# Patient Record
Sex: Female | Born: 1955 | Race: Asian | Hispanic: No | Smoking: Never smoker
Health system: Southern US, Community
[De-identification: ages and names within clinical notes are randomized; demographics above are authoritative.]

## PROBLEM LIST (undated history)

## (undated) DIAGNOSIS — E079 Disorder of thyroid, unspecified: Secondary | ICD-10-CM

## (undated) HISTORY — DX: Disorder of thyroid, unspecified: E07.9

---

## 1997-07-17 ENCOUNTER — Emergency Department (HOSPITAL_COMMUNITY): Admission: EM | Admit: 1997-07-17 | Discharge: 1997-07-17 | Payer: Self-pay | Admitting: *Deleted

## 1997-11-18 ENCOUNTER — Other Ambulatory Visit: Admission: RE | Admit: 1997-11-18 | Discharge: 1997-11-18 | Payer: Self-pay | Admitting: Obstetrics and Gynecology

## 1998-06-02 ENCOUNTER — Other Ambulatory Visit: Admission: RE | Admit: 1998-06-02 | Discharge: 1998-06-02 | Payer: Self-pay | Admitting: Obstetrics and Gynecology

## 1999-09-14 ENCOUNTER — Other Ambulatory Visit: Admission: RE | Admit: 1999-09-14 | Discharge: 1999-09-14 | Payer: Self-pay | Admitting: Obstetrics and Gynecology

## 2000-09-17 ENCOUNTER — Other Ambulatory Visit: Admission: RE | Admit: 2000-09-17 | Discharge: 2000-09-17 | Payer: Self-pay | Admitting: Obstetrics and Gynecology

## 2001-12-24 ENCOUNTER — Other Ambulatory Visit: Admission: RE | Admit: 2001-12-24 | Discharge: 2001-12-24 | Payer: Self-pay | Admitting: Obstetrics and Gynecology

## 2002-12-29 ENCOUNTER — Other Ambulatory Visit: Admission: RE | Admit: 2002-12-29 | Discharge: 2002-12-29 | Payer: Self-pay | Admitting: Obstetrics and Gynecology

## 2004-02-03 ENCOUNTER — Other Ambulatory Visit: Admission: RE | Admit: 2004-02-03 | Discharge: 2004-02-03 | Payer: Self-pay | Admitting: Obstetrics and Gynecology

## 2005-01-26 ENCOUNTER — Other Ambulatory Visit: Admission: RE | Admit: 2005-01-26 | Discharge: 2005-01-26 | Payer: Self-pay | Admitting: Obstetrics and Gynecology

## 2005-03-06 ENCOUNTER — Ambulatory Visit (HOSPITAL_COMMUNITY): Admission: RE | Admit: 2005-03-06 | Discharge: 2005-03-06 | Payer: Self-pay | Admitting: Obstetrics and Gynecology

## 2011-01-04 ENCOUNTER — Other Ambulatory Visit: Payer: Self-pay | Admitting: Obstetrics and Gynecology

## 2011-06-04 ENCOUNTER — Telehealth: Payer: Self-pay | Admitting: Physician Assistant

## 2011-06-04 MED ORDER — LEVOTHYROXINE SODIUM 50 MCG PO TABS: 50.0000 ug | ORAL_TABLET | Freq: Every day | ORAL | Status: DC

## 2011-06-04 NOTE — Telephone Encounter (Signed)
Fax from pharmacy that Levoxyl is on backorder, requesting alternative.

## 2011-11-28 ENCOUNTER — Encounter: Payer: Self-pay | Admitting: Internal Medicine

## 2012-01-02 ENCOUNTER — Ambulatory Visit (INDEPENDENT_AMBULATORY_CARE_PROVIDER_SITE_OTHER): Payer: PRIVATE HEALTH INSURANCE | Admitting: Internal Medicine

## 2012-01-02 ENCOUNTER — Encounter: Payer: Self-pay | Admitting: Internal Medicine

## 2012-01-02 VITALS — BP 112/72 | HR 80 | Temp 99.0°F | Resp 16 | Ht 61.75 in | Wt 115.8 lb

## 2012-01-02 DIAGNOSIS — E785 Hyperlipidemia, unspecified: Secondary | ICD-10-CM

## 2012-01-02 DIAGNOSIS — E039 Hypothyroidism, unspecified: Secondary | ICD-10-CM

## 2012-01-02 LAB — COMPREHENSIVE METABOLIC PANEL
ALT: 18 U/L (ref 0–35)
BUN: 15 mg/dL (ref 6–23)
CO2: 27 mEq/L (ref 19–32)
Calcium: 9.5 mg/dL (ref 8.4–10.5)
Chloride: 108 mEq/L (ref 96–112)
Creat: 0.6 mg/dL (ref 0.50–1.10)
Glucose, Bld: 104 mg/dL — ABNORMAL HIGH (ref 70–99)

## 2012-01-02 LAB — LIPID PANEL
Cholesterol: 265 mg/dL — ABNORMAL HIGH (ref 0–200)
Total CHOL/HDL Ratio: 4 Ratio
Triglycerides: 231 mg/dL — ABNORMAL HIGH (ref <150)

## 2012-01-02 LAB — T4, FREE: Free T4: 1.3 ng/dL (ref 0.80–1.80)

## 2012-01-02 NOTE — Progress Notes (Addendum)
  Subjective:    Patient ID: Alyssa Velazquez, female    DOB: 1955/03/13, 56 y.o.   MRN: [id number]  HPIFollowup for lab and meds Patient Active Problem List  Diagnosis  . Hypothyroidism  . Hyperlipidemia   Remains asymptomatic with no weight gain, no insomnia, no excessive fatigue, no hypersomnolence Is currently off statin medication and has been Estimated one hour ago-Last meal    Review of Systems Noncontributory    Objective:   Physical Exam No thyromegaly or lymphadenopathy Heart regular without murmur rate 80 Vital signs stable       Assessment & Plan:   1. Hypothyroidism  CBC with Differential, TSH, T4, Free  2. Hyperlipidemia  Comprehensive metabolic panel, Lipid panel   pe 1/14 presc meds after labs  Ate 1 hr ago  Results for orders placed in visit on 01/02/12  CBC WITH DIFFERENTIAL      Component Value Range   WBC 4.0  4.0 - 10.5 K/uL   RBC 4.06  3.87 - 5.11 MIL/uL   Hemoglobin 12.9  12.0 - 15.0 g/dL   HCT 47.8  29.5 - 62.1 %   MCV 92.9  78.0 - 100.0 fL   MCH 31.8  26.0 - 34.0 pg   MCHC 34.2  30.0 - 36.0 g/dL   RDW 30.8  65.7 - 84.6 %   Platelets 304  150 - 400 K/uL   Neutrophils Relative 55  43 - 77 %   Neutro Abs 2.2  1.7 - 7.7 K/uL   Lymphocytes Relative 37  12 - 46 %   Lymphs Abs 1.5  0.7 - 4.0 K/uL   Monocytes Relative 7  3 - 12 %   Monocytes Absolute 0.3  0.1 - 1.0 K/uL   Eosinophils Relative 1  0 - 5 %   Eosinophils Absolute 0.0  0.0 - 0.7 K/uL   Basophils Relative 0  0 - 1 %   Basophils Absolute 0.0  0.0 - 0.1 K/uL   Smear Review Criteria for review not met    COMPREHENSIVE METABOLIC PANEL      Component Value Range   Sodium 144  135 - 145 mEq/L   Potassium 4.0  3.5 - 5.3 mEq/L   Chloride 108  96 - 112 mEq/L   CO2 27  19 - 32 mEq/L   Glucose, Bld 104 (*) 70 - 99 mg/dL   BUN 15  6 - 23 mg/dL   Creat 9.62  9.52 - 8.41 mg/dL   Total Bilirubin 0.6  0.3 - 1.2 mg/dL   Alkaline Phosphatase 65  39 - 117 U/L   AST 19  0 - 37 U/L   ALT 18  0  - 35 U/L   Total Protein 6.5  6.0 - 8.3 g/dL   Albumin 4.6  3.5 - 5.2 g/dL   Calcium 9.5  8.4 - 32.4 mg/dL  LIPID PANEL      Component Value Range   Cholesterol 265 (*) 0 - 200 mg/dL   Triglycerides 401 (*) <150 mg/dL   HDL 67  >02 mg/dL   Total CHOL/HDL Ratio 4.0     VLDL 46 (*) 0 - 40 mg/dL   LDL Cholesterol 725 (*) 0 - 99 mg/dL  TSH      Component Value Range   TSH 1.994  0.350 - 4.500 uIU/mL  T4, FREE      Component Value Range   Free T4 1.30  0.80 - 1.80 ng/dL

## 2012-01-03 DIAGNOSIS — E039 Hypothyroidism, unspecified: Secondary | ICD-10-CM | POA: Insufficient documentation

## 2012-01-03 DIAGNOSIS — E785 Hyperlipidemia, unspecified: Secondary | ICD-10-CM | POA: Insufficient documentation

## 2012-01-03 LAB — CBC WITH DIFFERENTIAL/PLATELET
Eosinophils Absolute: 0 10*3/uL (ref 0.0–0.7)
Eosinophils Relative: 1 % (ref 0–5)
Lymphocytes Relative: 37 % (ref 12–46)
Lymphs Abs: 1.5 10*3/uL (ref 0.7–4.0)
MCHC: 34.2 g/dL (ref 30.0–36.0)
Monocytes Absolute: 0.3 10*3/uL (ref 0.1–1.0)
Monocytes Relative: 7 % (ref 3–12)
RDW: 13.7 % (ref 11.5–15.5)

## 2012-01-07 ENCOUNTER — Encounter: Payer: Self-pay | Admitting: Internal Medicine

## 2012-01-07 MED ORDER — LEVOTHYROXINE SODIUM 50 MCG PO TABS: 50.0000 ug | ORAL_TABLET | Freq: Every day | ORAL | Status: DC

## 2012-01-07 NOTE — Addendum Note (Signed)
Addended by: Tonye Pearson on: 01/07/2012 12:57 PM   Modules accepted: Orders

## 2012-01-08 ENCOUNTER — Other Ambulatory Visit: Payer: Self-pay | Admitting: *Deleted

## 2012-01-08 DIAGNOSIS — E039 Hypothyroidism, unspecified: Secondary | ICD-10-CM

## 2012-01-08 MED ORDER — LEVOTHYROXINE SODIUM 50 MCG PO TABS: 50.0000 ug | ORAL_TABLET | Freq: Every day | ORAL | Status: DC

## 2012-02-27 ENCOUNTER — Ambulatory Visit (INDEPENDENT_AMBULATORY_CARE_PROVIDER_SITE_OTHER): Payer: PRIVATE HEALTH INSURANCE | Admitting: Internal Medicine

## 2012-02-27 ENCOUNTER — Encounter: Payer: Self-pay | Admitting: Internal Medicine

## 2012-02-27 VITALS — BP 116/78 | HR 77 | Temp 98.3°F | Resp 16 | Ht 61.34 in | Wt 114.0 lb

## 2012-02-27 DIAGNOSIS — Z0000 Encounter for general adult medical examination without abnormal findings: Secondary | ICD-10-CM

## 2012-02-27 DIAGNOSIS — Z23 Encounter for immunization: Secondary | ICD-10-CM

## 2012-02-28 NOTE — Progress Notes (Signed)
Subjective:    Patient ID: Alyssa Velazquez, female    DOB: [birth year], 57 y.o.   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 No pertinent past medical history.  Review of Systems  Constitutional: Negative for fever, activity change, appetite change, fatigue and unexpected weight change.  HENT: Positive for trouble swallowing. Negative for hearing loss, neck pain, dental problem, voice change and tinnitus.        About q 2 mos has dysphagia/sticking near larynx but reolves overnight-no reflux-no dyspepsia  Eyes: Negative for visual disturbance.  Respiratory: Negative for cough and shortness of breath.   Cardiovascular: Negative for chest pain, palpitations and leg swelling.  Gastrointestinal: Negative for abdominal pain, diarrhea, constipation, blood in stool and abdominal distention.  Genitourinary: Negative for flank pain, difficulty urinating and pelvic pain.  Musculoskeletal: Negative for myalgias, back pain, joint swelling, arthralgias and gait problem.  Skin: Negative for rash.  Neurological: Negative for dizziness, speech difficulty, weakness, light-headedness and headaches.  Hematological: Negative for adenopathy. Does not bruise/bleed easily.  Psychiatric/Behavioral: Negative for sleep disturbance, dysphoric mood and decreased concentration. The patient is not nervous/anxious.        Objective:   Physical Exam  Constitutional: She is oriented to person, place, and time. She appears well-developed and well-nourished.  HENT:  Right Ear: External ear normal.  Left Ear: External ear normal.  Nose: Nose normal.  Mouth/Throat: Oropharynx is clear and moist.       Tms clear  Eyes: Conjunctivae normal and EOM are normal. Pupils are equal, round, and reactive to light.  Neck: Normal range of motion. Neck supple. No thyromegaly  present.  Cardiovascular: Normal rate, regular rhythm, normal heart sounds and intact distal pulses.   No murmur heard. Pulmonary/Chest: Effort normal and breath sounds normal.  Abdominal: Soft. Bowel sounds are normal. She exhibits no mass. There is no tenderness.  Musculoskeletal: Normal range of motion. She exhibits no edema.  Lymphadenopathy:    She has no cervical adenopathy.  Neurological: She is alert and oriented to person, place, and time. She has normal reflexes. She displays normal reflexes. No cranial nerve deficit.  Skin: No rash noted.  Psychiatric: She has a normal mood and affect. Her behavior is normal. Judgment and thought content normal.     Results for orders placed in visit on 01/02/12  CBC WITH DIFFERENTIAL      Component Value Range   WBC 4.0  4.0 - 10.5 K/uL   RBC 4.06  3.87 - 5.11 MIL/uL   Hemoglobin 12.9  12.0 - 15.0 g/dL   HCT 59.5  63.8 - 75.6 %   MCV 92.9  78.0 - 100.0 fL   MCH 31.8  26.0 - 34.0 pg   MCHC 34.2  30.0 - 36.0 g/dL   RDW 43.3  29.5 - 18.8 %   Platelets 304  150 - 400 K/uL   Neutrophils Relative 55  43 - 77 %   Neutro Abs 2.2  1.7 - 7.7 K/uL   Lymphocytes Relative 37  12 - 46 %   Lymphs Abs 1.5  0.7 - 4.0 K/uL   Monocytes Relative 7  3 - 12 %   Monocytes Absolute 0.3  0.1 - 1.0 K/uL   Eosinophils Relative 1  0 - 5 %   Eosinophils Absolute 0.0  0.0 - 0.7 K/uL  Basophils Relative 0  0 - 1 %   Basophils Absolute 0.0  0.0 - 0.1 K/uL   Smear Review Criteria for review not met    COMPREHENSIVE METABOLIC PANEL      Component Value Range   Sodium 144  135 - 145 mEq/L   Potassium 4.0  3.5 - 5.3 mEq/L   Chloride 108  96 - 112 mEq/L   CO2 27  19 - 32 mEq/L   Glucose, Bld 104 (*) 70 - 99 mg/dL   BUN 15  6 - 23 mg/dL   Creat 9.62  9.52 - 8.41 mg/dL   Total Bilirubin 0.6  0.3 - 1.2 mg/dL   Alkaline Phosphatase 65  39 - 117 U/L   AST 19  0 - 37 U/L   ALT 18  0 - 35 U/L   Total Protein 6.5  6.0 - 8.3 g/dL   Albumin 4.6  3.5 - 5.2 g/dL    Calcium 9.5  8.4 - 32.4 mg/dL  LIPID PANEL      Component Value Range   Cholesterol 265 (*) 0 - 200 mg/dL   Triglycerides 401 (*) <150 mg/dL   HDL 67  >02 mg/dL   Total CHOL/HDL Ratio 4.0     VLDL 46 (*) 0 - 40 mg/dL   LDL Cholesterol 725 (*) 0 - 99 mg/dL  TSH      Component Value Range   TSH 1.994  0.350 - 4.500 uIU/mL  T4, FREE      Component Value Range   Free T4 1.30  0.80 - 1.80 ng/dL   Labs Good w/ low risk CV dz/good HDL     Assessment & Plan:  Annual PE Healthy w/ contr hypothy and borderline lipids Ba swall if dysph increases

## 2013-01-30 ENCOUNTER — Other Ambulatory Visit: Payer: Self-pay | Admitting: Internal Medicine

## 2013-01-30 NOTE — Telephone Encounter (Signed)
Spoke to pt. Reminded she needed an appt. She asked to be transferred to appts. She will be in the office in January. Sent in a 30 day supply.

## 2013-02-27 ENCOUNTER — Encounter: Payer: Self-pay | Admitting: Internal Medicine

## 2013-02-27 ENCOUNTER — Ambulatory Visit (INDEPENDENT_AMBULATORY_CARE_PROVIDER_SITE_OTHER): Payer: BC Managed Care – PPO | Admitting: Internal Medicine

## 2013-02-27 VITALS — BP 120/80 | HR 87 | Temp 98.0°F | Resp 16 | Ht 61.5 in | Wt 115.0 lb

## 2013-02-27 DIAGNOSIS — E785 Hyperlipidemia, unspecified: Secondary | ICD-10-CM

## 2013-02-27 DIAGNOSIS — Z0000 Encounter for general adult medical examination without abnormal findings: Secondary | ICD-10-CM

## 2013-02-27 DIAGNOSIS — E039 Hypothyroidism, unspecified: Secondary | ICD-10-CM

## 2013-02-27 DIAGNOSIS — Z23 Encounter for immunization: Secondary | ICD-10-CM

## 2013-02-27 LAB — CBC WITH DIFFERENTIAL/PLATELET
BASOS ABS: 0 10*3/uL (ref 0.0–0.1)
BASOS PCT: 0 % (ref 0–1)
EOS ABS: 0 10*3/uL (ref 0.0–0.7)
EOS PCT: 1 % (ref 0–5)
HCT: 41.7 % (ref 36.0–46.0)
Hemoglobin: 14.2 g/dL (ref 12.0–15.0)
LYMPHS PCT: 33 % (ref 12–46)
Lymphs Abs: 1.7 10*3/uL (ref 0.7–4.0)
MCH: 32.5 pg (ref 26.0–34.0)
MCHC: 34.1 g/dL (ref 30.0–36.0)
MCV: 95.4 fL (ref 78.0–100.0)
Monocytes Absolute: 0.3 10*3/uL (ref 0.1–1.0)
Monocytes Relative: 6 % (ref 3–12)
Neutro Abs: 3.1 10*3/uL (ref 1.7–7.7)
Neutrophils Relative %: 60 % (ref 43–77)
PLATELETS: 279 10*3/uL (ref 150–400)
RBC: 4.37 MIL/uL (ref 3.87–5.11)
RDW: 13.8 % (ref 11.5–15.5)
WBC: 5.2 10*3/uL (ref 4.0–10.5)

## 2013-02-27 LAB — LIPID PANEL
Cholesterol: 327 mg/dL — ABNORMAL HIGH (ref 0–200)
HDL: 69 mg/dL (ref >39)
LDL CALC: 232 mg/dL — AB (ref 0–99)
Total CHOL/HDL Ratio: 4.7 Ratio
Triglycerides: 128 mg/dL (ref <150)
VLDL: 26 mg/dL (ref 0–40)

## 2013-02-27 LAB — COMPREHENSIVE METABOLIC PANEL
ALBUMIN: 4.5 g/dL (ref 3.5–5.2)
ALT: 32 U/L (ref 0–35)
AST: 28 U/L (ref 0–37)
Alkaline Phosphatase: 68 U/L (ref 39–117)
BUN: 15 mg/dL (ref 6–23)
CHLORIDE: 105 meq/L (ref 96–112)
CO2: 29 meq/L (ref 19–32)
Calcium: 9.6 mg/dL (ref 8.4–10.5)
Creat: 0.57 mg/dL (ref 0.50–1.10)
GLUCOSE: 102 mg/dL — AB (ref 70–99)
POTASSIUM: 4.2 meq/L (ref 3.5–5.3)
SODIUM: 143 meq/L (ref 135–145)
TOTAL PROTEIN: 6.9 g/dL (ref 6.0–8.3)
Total Bilirubin: 0.8 mg/dL (ref 0.3–1.2)

## 2013-02-27 MED ORDER — HYDROCOD POLST-CHLORPHEN POLST 10-8 MG/5ML PO LQCR: 5.0000 mL | Freq: Two times a day (BID) | ORAL | Status: DC | PRN

## 2013-02-27 MED ORDER — LEVOTHYROXINE SODIUM 50 MCG PO TABS: ORAL_TABLET | ORAL | Status: DC

## 2013-02-27 NOTE — Progress Notes (Signed)
Subjective:    Patient ID: Alyssa Velazquez, female    DOB: May 12, 1955, 58 y.o.   MRN: [id number] This chart was scribed for Dr. Merla Riches, by Valera Castle, ED Scribe. This patient was seen in room 2 and the patient's care was started at 11:35 AM.  Chief Complaint  Patient presents with  . Annual Exam    HPI Alyssa Velazquez is a 58 y.o. female with h/o hypothyroidism who presents to the [facility] for an annual exam. Per her note from her last visit, her thyroid levels were normal and cholesterol was high.   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 She denies having flu immunization this year, but is compliant to having it today. Her tetanus is UTD, last in 2009. She reports her thyroidism is in check, but states that sometimes in the morning her thryoid seems to be enlarged.   She reports a headache, that radiates to her neck, onset 10 days ago. She also reports mild, intermittent cough, residual from recent cold. She reports sleeping normally, and states she has been eating well, vegetables and fruit. She denies exercising regularly. She denies fever, decreased hearing, trouble swallowing, and any other associated symptoms.    Immunizations up to date with last TDap-2009-has new grand baby 6 months Routine health maintenance issues today Dr. Tenny Craw GYN today   Patient Active Problem List   Diagnosis Date Noted  . Hypothyroidism 01/03/2012  . Hyperlipidemia 01/03/2012    Review of Systems  Constitutional: Negative for fever.  HENT: Positive for rhinorrhea. Negative for hearing loss and trouble swallowing.   Respiratory: Positive for cough.   Neurological: Positive for headaches.  Psychiatric/Behavioral: Negative for sleep disturbance and dysphoric mood.  All other systems reviewed and are negative.      Objective:   Physical Exam  Nursing note and vitals reviewed. Constitutional: She is oriented to person, place, and time. She appears well-developed  and well-nourished. No distress.  HENT:  Head: Normocephalic and atraumatic.  Right Ear: Tympanic membrane and external ear normal.  Left Ear: Tympanic membrane and external ear normal.  Nose: Mucosal edema and rhinorrhea present.  Mouth/Throat: Oropharynx is clear and moist and mucous membranes are normal.  Eyes: EOM are normal.  Neck: Neck supple. No thyromegaly present.  Cardiovascular: Normal rate, regular rhythm and normal heart sounds.   No murmur heard. Pulmonary/Chest: Effort normal and breath sounds normal. No respiratory distress. She has no wheezes. She has no rales.  Abdominal: Soft. She exhibits no mass. There is no tenderness.  Musculoskeletal: Normal range of motion. She exhibits no tenderness.  Lymphadenopathy:    She has no cervical adenopathy.  Neurological: She is alert and oriented to person, place, and time.  Skin: Skin is warm and dry.  Psychiatric: She has a normal mood and affect. Her behavior is normal.    BP 120/80  Pulse 87  Temp(Src) 98 F (36.7 C) (Oral)  Resp 16  Ht 5' 1.5" (1.562 m)  Wt 115 lb (52.164 kg)  BMI 21.38 kg/m2  SpO2 98%     Assessment & Plan:   1. Hypothyroidism   2. Hyperlipidemia   3. Annual physical exam   4.recent uri w/ persistant cough  Meds ordered this encounter  Medications  . levothyroxine (SYNTHROID, LEVOTHROID) 50 MCG tablet    Sig: take 1 tablet by mouth once daily    Dispense:  90 tablet    Refill:  3  .  chlorpheniramine-HYDROcodone (TUSSIONEX PENNKINETIC ER) 10-8 MG/5ML LQCR    Sig: Take 5 mLs by mouth every 12 (twelve) hours as needed for cough.    Dispense:  115 mL    Refill:  0       I have completed the patient encounter in its entirety as documented by the scribe, with editing by me where necessary. Austyn Perriello P. Merla Richesoolittle, M.D.

## 2013-02-28 LAB — T4, FREE: FREE T4: 1.38 ng/dL (ref 0.80–1.80)

## 2013-02-28 LAB — TSH: TSH: 3.33 u[IU]/mL (ref 0.350–4.500)

## 2013-03-03 ENCOUNTER — Other Ambulatory Visit: Payer: Self-pay | Admitting: Physician Assistant

## 2013-03-03 MED ORDER — ATORVASTATIN CALCIUM 10 MG PO TABS: 10.0000 mg | ORAL_TABLET | Freq: Every day | ORAL | Status: DC

## 2013-03-03 MED ORDER — LEVOTHYROXINE SODIUM 50 MCG PO TABS: ORAL_TABLET | ORAL | Status: DC

## 2013-03-03 NOTE — Addendum Note (Signed)
Addended by: Johnnette LitterARDWELL, Janaisha Tolsma M on: 03/03/2013 02:14 PM   Modules accepted: Orders, Medications

## 2013-05-26 ENCOUNTER — Ambulatory Visit (INDEPENDENT_AMBULATORY_CARE_PROVIDER_SITE_OTHER): Payer: BC Managed Care – PPO | Admitting: Internal Medicine

## 2013-05-26 VITALS — BP 120/75 | HR 68

## 2013-05-26 DIAGNOSIS — R05 Cough: Secondary | ICD-10-CM

## 2013-05-26 DIAGNOSIS — R059 Cough, unspecified: Secondary | ICD-10-CM

## 2013-05-26 DIAGNOSIS — J019 Acute sinusitis, unspecified: Secondary | ICD-10-CM

## 2013-05-26 MED ORDER — AMOXICILLIN 875 MG PO TABS: 875.0000 mg | ORAL_TABLET | Freq: Two times a day (BID) | ORAL | Status: DC

## 2013-05-26 MED ORDER — HYDROCOD POLST-CHLORPHEN POLST 10-8 MG/5ML PO LQCR: 5.0000 mL | Freq: Two times a day (BID) | ORAL | Status: DC | PRN

## 2013-05-26 MED ORDER — BENZONATATE 100 MG PO CAPS: 100.0000 mg | ORAL_CAPSULE | Freq: Two times a day (BID) | ORAL | Status: DC | PRN

## 2013-05-26 NOTE — Progress Notes (Signed)
   Subjective:    Patient ID: Alyssa Velazquez, female    DOB: 1955/12/12, 58 y.o.   MRN: [id number]  [place]-sick last 10 days with cong/cough/hoarseness/abd muscles sore from cramping Fever at night  Review of Systems Noncontributory    Objective:   Physical Exam Blood pressure and pulse stable TMs clear Nares congested/purulent mucus/tendon maxillary areas Throat clear/no nodes Chest clear to auscultation        Assessment & Plan:  Sinusitis with cough Meds ordered this encounter  Medications  . amoxicillin (AMOXIL) 875 MG tablet    Sig: Take 1 tablet (875 mg total) by mouth 2 (two) times daily.    Dispense:  20 tablet    Refill:  0  . chlorpheniramine-HYDROcodone (TUSSIONEX PENNKINETIC ER) 10-8 MG/5ML LQCR    Sig: Take 5 mLs by mouth every 12 (twelve) hours as needed for cough.    Dispense:  115 mL    Refill:  0  . benzonatate (TESSALON) 100 MG capsule    Sig: Take 1 capsule (100 mg total) by mouth 2 (two) times daily as needed for cough.    Dispense:  20 capsule    Refill:  0   Recheck appointment in 10 days

## 2013-06-03 ENCOUNTER — Ambulatory Visit (INDEPENDENT_AMBULATORY_CARE_PROVIDER_SITE_OTHER): Payer: BC Managed Care – PPO | Admitting: Internal Medicine

## 2013-06-03 VITALS — BP 124/80 | HR 69 | Temp 98.0°F | Resp 16 | Ht 61.5 in | Wt 114.0 lb

## 2013-06-03 DIAGNOSIS — E785 Hyperlipidemia, unspecified: Secondary | ICD-10-CM

## 2013-06-03 DIAGNOSIS — E039 Hypothyroidism, unspecified: Secondary | ICD-10-CM

## 2013-06-03 DIAGNOSIS — R059 Cough, unspecified: Secondary | ICD-10-CM

## 2013-06-03 DIAGNOSIS — R05 Cough: Secondary | ICD-10-CM

## 2013-06-03 LAB — LIPID PANEL
Cholesterol: 170 mg/dL (ref 0–200)
HDL: 51 mg/dL (ref >39)
LDL Cholesterol: 107 mg/dL — ABNORMAL HIGH (ref 0–99)
Total CHOL/HDL Ratio: 3.3 Ratio
Triglycerides: 62 mg/dL (ref <150)
VLDL: 12 mg/dL (ref 0–40)

## 2013-06-03 MED ORDER — HYDROCOD POLST-CHLORPHEN POLST 10-8 MG/5ML PO LQCR: 5.0000 mL | Freq: Two times a day (BID) | ORAL | Status: DC | PRN

## 2013-06-03 NOTE — Progress Notes (Signed)
   Subjective:    Patient ID: Alyssa Velazquez, female    DOB: 09-24-1955, 58 y.o.   MRN: [id number] This chart was scribed for Tonye Pearsonobert P Kyira Volkert, MD by Charline BillsEssence Howell, ED Scribe. The patient was seen in room 25. Patient's care was started at 11:32 AM.  HPI HPI Comments: Alyssa Velazquez is a 58 y.o. female who presents to the [facility] for lab work for thyroid and cholesterol.   She coughs more at night, but states that the medication has improved her productive cough. Pt shares her desire for Tussionex refill. She reports associated postnasal drip and feelings of a heavy head at times. Pt denies seasonal allergies. She reports hearing loss in her L ear, but states that it has improved over the past 2 days. She will finish her antibiotic tomorrow. She reports feeling fatigue only with taking the antibiotic.     Review of Systems  Constitutional: Negative for fever and fatigue.  Respiratory: Negative for shortness of breath.       Objective:   Physical Exam  Nursing note and vitals reviewed. Constitutional: She is oriented to person, place, and time. She appears well-developed and well-nourished. No distress.  HENT:  Head: Normocephalic and atraumatic.  Right Ear: External ear normal.  Left Ear: External ear normal.  Mouth/Throat: Oropharynx is clear and moist.  Boggy turbs  Eyes: EOM are normal. Pupils are equal, round, and reactive to light.  Neck: Normal range of motion. Neck supple. No thyromegaly present.  Cardiovascular: Normal rate, regular rhythm and normal heart sounds.   Pulmonary/Chest: Effort normal and breath sounds normal. No respiratory distress. She has no wheezes.  Musculoskeletal: Normal range of motion.  Lymphadenopathy:    She has no cervical adenopathy.  Neurological: She is alert and oriented to person, place, and time.  Skin: Skin is warm and dry.  Psychiatric: She has a normal mood and affect. Her behavior is normal.      Assessment & Plan:    Hyperlipidemia - Plan: Lipid panel///lipitor 10 started 02/2013  Hypothyroidism--stable 02/2013  Cough--finish amox-ref tussionex/  Call w/ labs

## 2013-06-07 ENCOUNTER — Encounter: Payer: Self-pay | Admitting: Internal Medicine

## 2013-06-07 MED ORDER — ATORVASTATIN CALCIUM 10 MG PO TABS: 10.0000 mg | ORAL_TABLET | Freq: Every day | ORAL | Status: DC

## 2013-06-07 NOTE — Addendum Note (Signed)
Addended by: Tonye PearsonOLITTLE, Cherrill Scrima P on: 06/07/2013 04:37 PM   Modules accepted: Orders

## 2014-02-10 ENCOUNTER — Other Ambulatory Visit: Payer: Self-pay | Admitting: Internal Medicine

## 2014-03-31 ENCOUNTER — Other Ambulatory Visit: Payer: Self-pay | Admitting: Internal Medicine

## 2014-04-21 ENCOUNTER — Ambulatory Visit (INDEPENDENT_AMBULATORY_CARE_PROVIDER_SITE_OTHER): Payer: BLUE CROSS/BLUE SHIELD | Admitting: Internal Medicine

## 2014-04-21 ENCOUNTER — Encounter: Payer: Self-pay | Admitting: Internal Medicine

## 2014-04-21 VITALS — BP 110/72 | HR 73 | Temp 98.1°F | Resp 16 | Ht 61.5 in | Wt 117.6 lb

## 2014-04-21 DIAGNOSIS — E785 Hyperlipidemia, unspecified: Secondary | ICD-10-CM

## 2014-04-21 DIAGNOSIS — E039 Hypothyroidism, unspecified: Secondary | ICD-10-CM

## 2014-04-21 LAB — CBC WITH DIFFERENTIAL/PLATELET
Basophils Absolute: 0 10*3/uL (ref 0.0–0.1)
Basophils Relative: 0 % (ref 0–1)
Eosinophils Absolute: 0 10*3/uL (ref 0.0–0.7)
Eosinophils Relative: 1 % (ref 0–5)
HEMATOCRIT: 40.3 % (ref 36.0–46.0)
Hemoglobin: 13.6 g/dL (ref 12.0–15.0)
LYMPHS ABS: 1.6 10*3/uL (ref 0.7–4.0)
Lymphocytes Relative: 33 % (ref 12–46)
MCH: 32.2 pg (ref 26.0–34.0)
MCHC: 33.7 g/dL (ref 30.0–36.0)
MCV: 95.3 fL (ref 78.0–100.0)
MONO ABS: 0.3 10*3/uL (ref 0.1–1.0)
MONOS PCT: 7 % (ref 3–12)
MPV: 9.6 fL (ref 8.6–12.4)
Neutro Abs: 2.8 10*3/uL (ref 1.7–7.7)
Neutrophils Relative %: 59 % (ref 43–77)
PLATELETS: 259 10*3/uL (ref 150–400)
RBC: 4.23 MIL/uL (ref 3.87–5.11)
RDW: 13.3 % (ref 11.5–15.5)
WBC: 4.7 10*3/uL (ref 4.0–10.5)

## 2014-04-21 LAB — LIPID PANEL
CHOL/HDL RATIO: 2.9 ratio
CHOLESTEROL: 200 mg/dL (ref 0–200)
HDL: 68 mg/dL (ref >=46)
LDL Cholesterol: 97 mg/dL (ref 0–99)
TRIGLYCERIDES: 177 mg/dL — AB (ref <150)
VLDL: 35 mg/dL (ref 0–40)

## 2014-04-21 LAB — COMPREHENSIVE METABOLIC PANEL
ALK PHOS: 61 U/L (ref 39–117)
ALT: 23 U/L (ref 0–35)
AST: 21 U/L (ref 0–37)
Albumin: 4.6 g/dL (ref 3.5–5.2)
BILIRUBIN TOTAL: 0.7 mg/dL (ref 0.2–1.2)
BUN: 10 mg/dL (ref 6–23)
CO2: 26 mEq/L (ref 19–32)
Calcium: 9.9 mg/dL (ref 8.4–10.5)
Chloride: 105 mEq/L (ref 96–112)
Creat: 0.52 mg/dL (ref 0.50–1.10)
Glucose, Bld: 95 mg/dL (ref 70–99)
Potassium: 3.6 mEq/L (ref 3.5–5.3)
Sodium: 141 mEq/L (ref 135–145)
Total Protein: 6.8 g/dL (ref 6.0–8.3)

## 2014-04-21 LAB — TSH: TSH: 4.03 u[IU]/mL (ref 0.350–4.500)

## 2014-04-22 NOTE — Progress Notes (Signed)
Here for follow-up labs Hypothyroidism, unspecified hypothyroidism type - Plan: CBC with Differential/Platelet, Comprehensive metabolic panel, TSH  Hyperlipidemia - Plan: Lipid panel   Prior to Admission medications   Medication Sig Start Date End Date Taking? Authorizing Provider  atorvastatin (LIPITOR) 10 MG tablet Take 1 tablet (10 mg total) by mouth daily. 06/07/13  Yes Leandrew Koyanagi, MD  levothyroxine (SYNTHROID, LEVOTHROID) 50 MCG tablet Take 1 tablet (50 mcg total) by mouth daily before breakfast. NO MORE REFILLS WITHOUT OFFICE VISIT - 2ND NOTICE 03/31/14  Yes Shawnee Knapp, MD   She has noticed more fatigue over the last few months without dryness of skin or loss of hair. Her sleep is good except 2 or 3 times a month she'll be stressed and will sleep poorly/ no sleep apnea symptoms No unusual weight changes  BP 110/72 mmHg  Pulse 73  Temp(Src) 98.1 F (36.7 C) (Oral)  Resp 16  Ht 5' 1.5" (1.562 m)  Wt 117 lb 9.6 oz (53.343 kg)  BMI 21.86 kg/m2  SpO2 99%  Results for orders placed or performed in visit on 04/21/14  CBC with Differential/Platelet  Result Value Ref Range   WBC 4.7 4.0 - 10.5 K/uL   RBC 4.23 3.87 - 5.11 MIL/uL   Hemoglobin 13.6 12.0 - 15.0 g/dL   HCT 40.3 36.0 - 46.0 %   MCV 95.3 78.0 - 100.0 fL   MCH 32.2 26.0 - 34.0 pg   MCHC 33.7 30.0 - 36.0 g/dL   RDW 13.3 11.5 - 15.5 %   Platelets 259 150 - 400 K/uL   MPV 9.6 8.6 - 12.4 fL   Neutrophils Relative % 59 43 - 77 %   Neutro Abs 2.8 1.7 - 7.7 K/uL   Lymphocytes Relative 33 12 - 46 %   Lymphs Abs 1.6 0.7 - 4.0 K/uL   Monocytes Relative 7 3 - 12 %   Monocytes Absolute 0.3 0.1 - 1.0 K/uL   Eosinophils Relative 1 0 - 5 %   Eosinophils Absolute 0.0 0.0 - 0.7 K/uL   Basophils Relative 0 0 - 1 %   Basophils Absolute 0.0 0.0 - 0.1 K/uL   Smear Review Criteria for review not met   Comprehensive metabolic panel  Result Value Ref Range   Sodium 141 135 - 145 mEq/L   Potassium 3.6 3.5 - 5.3 mEq/L   Chloride  105 96 - 112 mEq/L   CO2 26 19 - 32 mEq/L   Glucose, Bld 95 70 - 99 mg/dL   BUN 10 6 - 23 mg/dL   Creat 0.52 0.50 - 1.10 mg/dL   Total Bilirubin 0.7 0.2 - 1.2 mg/dL   Alkaline Phosphatase 61 39 - 117 U/L   AST 21 0 - 37 U/L   ALT 23 0 - 35 U/L   Total Protein 6.8 6.0 - 8.3 g/dL   Albumin 4.6 3.5 - 5.2 g/dL   Calcium 9.9 8.4 - 10.5 mg/dL  TSH  Result Value Ref Range   TSH 4.030 0.350 - 4.500 uIU/mL  Lipid panel  Result Value Ref Range   Cholesterol 200 0 - 200 mg/dL   Triglycerides 177 (H) <150 mg/dL   HDL 68 >=46 mg/dL   Total CHOL/HDL Ratio 2.9 Ratio   VLDL 35 0 - 40 mg/dL   LDL Cholesterol 97 0 - 99 mg/dL   Impression Responding well to treatment She is scheduled a full physical examination so we can investigate fatigue further

## 2014-04-26 ENCOUNTER — Encounter: Payer: Self-pay | Admitting: Internal Medicine

## 2014-05-03 ENCOUNTER — Other Ambulatory Visit: Payer: Self-pay | Admitting: Internal Medicine

## 2014-06-11 ENCOUNTER — Ambulatory Visit (INDEPENDENT_AMBULATORY_CARE_PROVIDER_SITE_OTHER): Payer: BLUE CROSS/BLUE SHIELD

## 2014-06-11 ENCOUNTER — Ambulatory Visit (INDEPENDENT_AMBULATORY_CARE_PROVIDER_SITE_OTHER): Payer: BLUE CROSS/BLUE SHIELD | Admitting: Internal Medicine

## 2014-06-11 VITALS — BP 124/84 | HR 75 | Temp 98.4°F | Resp 16 | Ht 61.5 in | Wt 116.2 lb

## 2014-06-11 DIAGNOSIS — R05 Cough: Secondary | ICD-10-CM | POA: Diagnosis not present

## 2014-06-11 DIAGNOSIS — J301 Allergic rhinitis due to pollen: Secondary | ICD-10-CM | POA: Diagnosis not present

## 2014-06-11 DIAGNOSIS — R0789 Other chest pain: Secondary | ICD-10-CM

## 2014-06-11 DIAGNOSIS — R059 Cough, unspecified: Secondary | ICD-10-CM

## 2014-06-11 MED ORDER — BENZONATATE 100 MG PO CAPS: 100.0000 mg | ORAL_CAPSULE | Freq: Two times a day (BID) | ORAL | Status: DC | PRN

## 2014-06-11 MED ORDER — CETIRIZINE HCL 10 MG PO TABS: 10.0000 mg | ORAL_TABLET | Freq: Every day | ORAL | Status: DC

## 2014-06-11 MED ORDER — FLUTICASONE PROPIONATE 50 MCG/ACT NA SUSP: NASAL | Status: DC

## 2014-06-11 NOTE — Progress Notes (Addendum)
   Subjective:    Patient ID: Alyssa Velazquez, female    DOB: 01/06/1956, 59 y.o.   MRN: [id number]  Chief Complaint  Patient presents with  . Cough    X 3 weeks off & on    HPI  HPI Comments: Alyssa AmassMarie C Havlik is a 59 y.o. female who presents to the [facility] complaining of an itching, sore throat and mild coughing that has been intermittent for 3 weeks. She also complains of itching in her nose. She denies a history of having issues with seasonal allergies, and thinks she may have caught an infection from someone. She denies any sleep disturbances from her cough. She denies eye itching, nasal congestion, or fever.  She also occasionally feels a "pinch" on a pinpoint spot on her right posterior lower rib, which has been ongoing for several months. Patient does exercise frequently.   Review of Systems  Constitutional: Negative for fever.  HENT: Positive for sore throat. Negative for congestion.   Eyes: Negative for itching.  Respiratory: Positive for cough.   Musculoskeletal: Positive for myalgias (rib pain).      Objective:   Physical Exam  Constitutional: She is oriented to person, place, and time. She appears well-developed and well-nourished. No distress.  HENT:  Right Ear: External ear normal.  Left Ear: External ear normal.  Mouth/Throat: Oropharynx is clear and moist. No oropharyngeal exudate.  Boggy turbinates with clear rhinorrhea  Eyes: EOM are normal. Pupils are equal, round, and reactive to light.  Conjunctiva injected  Neck: Neck supple. No thyromegaly present.  Cardiovascular: Normal rate, regular rhythm and normal heart sounds.   No murmur heard. Pulmonary/Chest: Effort normal and breath sounds normal. She has no wheezes.  She has tenderness in the posterior axillary line along the 5th and 6th rib area on the right.   Lymphadenopathy:    She has no cervical adenopathy.  Neurological: She is alert and oriented to person, place, and time. No cranial  nerve deficit.  Psychiatric: She has a normal mood and affect.  Nursing note and vitals reviewed.  [facility] Lungs are clear, and there is no bony abnormality to explain her pain.      Assessment & Plan:   I have completed the patient encounter in its entirety as documented by the scribe, with editing by me where necessary. Kavontae Pritchard P. Merla Richesoolittle, M.D.   Chest wall pain -reassured that the cause of pain was likely muscle attached to rib and not dangerous  allergic rhinitis--- this should be the source for cough and meds are started Prolonged cough secondary  Meds ordered this encounter  Medications  . ibuprofen (ADVIL,MOTRIN) 200 MG tablet    Sig: Take 400 mg by mouth every 6 (six) hours as needed.  . cetirizine (ZYRTEC) 10 MG tablet    Sig: Take 1 tablet (10 mg total) by mouth daily.    Dispense:  30 tablet    Refill:  11  . fluticasone (FLONASE) 50 MCG/ACT nasal spray    Sig: 1 spray each nostril twice a day    Dispense:  16 g    Refill:  6  . benzonatate (TESSALON) 100 MG capsule    Sig: Take 1 capsule (100 mg total) by mouth 2 (two) times daily as needed for cough.    Dispense:  20 capsule    Refill:  0   Follow-up 3 weeks if not controlled

## 2014-06-12 NOTE — Addendum Note (Signed)
Addended by: Tonye PearsonOLITTLE, Olean Sangster P on: 06/12/2014 02:50 PM   Modules accepted: Level of Service

## 2014-08-23 ENCOUNTER — Other Ambulatory Visit: Payer: Self-pay | Admitting: Internal Medicine

## 2014-11-10 ENCOUNTER — Other Ambulatory Visit: Payer: Self-pay | Admitting: Physician Assistant

## 2014-12-17 ENCOUNTER — Other Ambulatory Visit: Payer: Self-pay | Admitting: Internal Medicine

## 2015-01-16 ENCOUNTER — Other Ambulatory Visit: Payer: Self-pay | Admitting: Internal Medicine

## 2015-01-26 ENCOUNTER — Encounter: Payer: Self-pay | Admitting: Internal Medicine

## 2015-01-26 ENCOUNTER — Ambulatory Visit (INDEPENDENT_AMBULATORY_CARE_PROVIDER_SITE_OTHER): Payer: BLUE CROSS/BLUE SHIELD | Admitting: Internal Medicine

## 2015-01-26 VITALS — BP 146/98 | HR 99 | Temp 98.1°F | Resp 16 | Ht 62.0 in | Wt 117.0 lb

## 2015-01-26 DIAGNOSIS — E785 Hyperlipidemia, unspecified: Secondary | ICD-10-CM

## 2015-01-26 DIAGNOSIS — E039 Hypothyroidism, unspecified: Secondary | ICD-10-CM

## 2015-01-26 DIAGNOSIS — Z23 Encounter for immunization: Secondary | ICD-10-CM | POA: Diagnosis not present

## 2015-01-26 LAB — CBC WITH DIFFERENTIAL/PLATELET
BASOS ABS: 0 10*3/uL (ref 0.0–0.1)
BASOS PCT: 0 % (ref 0–1)
Eosinophils Absolute: 0 10*3/uL (ref 0.0–0.7)
Eosinophils Relative: 1 % (ref 0–5)
HEMATOCRIT: 38.4 % (ref 36.0–46.0)
HEMOGLOBIN: 13.6 g/dL (ref 12.0–15.0)
LYMPHS PCT: 39 % (ref 12–46)
Lymphs Abs: 1.5 10*3/uL (ref 0.7–4.0)
MCH: 32.8 pg (ref 26.0–34.0)
MCHC: 35.4 g/dL (ref 30.0–36.0)
MCV: 92.5 fL (ref 78.0–100.0)
MONO ABS: 0.2 10*3/uL (ref 0.1–1.0)
MPV: 9.6 fL (ref 8.6–12.4)
Monocytes Relative: 6 % (ref 3–12)
NEUTROS ABS: 2.1 10*3/uL (ref 1.7–7.7)
NEUTROS PCT: 54 % (ref 43–77)
Platelets: 261 10*3/uL (ref 150–400)
RBC: 4.15 MIL/uL (ref 3.87–5.11)
RDW: 13.2 % (ref 11.5–15.5)
WBC: 3.8 10*3/uL — AB (ref 4.0–10.5)

## 2015-01-26 LAB — LIPID PANEL
CHOL/HDL RATIO: 4.2 ratio (ref <=5.0)
CHOLESTEROL: 274 mg/dL — AB (ref 125–200)
HDL: 66 mg/dL (ref >=46)
LDL CALC: 186 mg/dL — AB (ref <130)
TRIGLYCERIDES: 112 mg/dL (ref <150)
VLDL: 22 mg/dL (ref <30)

## 2015-01-26 LAB — TSH: TSH: 1.173 u[IU]/mL (ref 0.350–4.500)

## 2015-01-26 NOTE — Progress Notes (Signed)
   Subjective:    Patient ID: Alyssa AmassMarie C Vensel, female    DOB: Dec 25, 1955, 59 y.o.   MRN: [id number]  [id number] here for fu Need for prophylactic vaccination and inoculation against influenza  Hypothyroidism, unspecified hypothyroidism type  Hyperlipidemia - decided not to take the lipitor for last several months  Stressful fall as mom has heart and kidney failure Not as much fatigue as last OV Some pain L leg 2-3 months /post thigh to upper calf--no back issues--started after exercises partial squats  See [facility]-gyn and mammo UTD [place]   Review of Systems  Constitutional: Negative for activity change, appetite change, fatigue and unexpected weight change.  HENT: Negative for trouble swallowing.   Eyes: Negative for visual disturbance.  Respiratory: Negative for shortness of breath.   Cardiovascular: Negative for chest pain, palpitations and leg swelling.  Gastrointestinal: Negative for abdominal pain.  Genitourinary: Negative for difficulty urinating.  Musculoskeletal: Negative for back pain and gait problem.  Neurological: Negative for light-headedness and headaches.  Hematological: Does not bruise/bleed easily.  Psychiatric/Behavioral: Negative for sleep disturbance, dysphoric mood and decreased concentration.       Objective:   Physical Exam  Constitutional: She is oriented to person, place, and time. She appears well-developed and well-nourished.  HENT:  Mouth/Throat: Oropharynx is clear and moist.  Eyes: EOM are normal. Pupils are equal, round, and reactive to light.  Neck: No thyromegaly present.  Cardiovascular: Normal rate, regular rhythm, normal heart sounds and intact distal pulses.   No murmur heard. Pulmonary/Chest: Breath sounds normal.  Musculoskeletal: She exhibits no edema.  Tender post popliteal in muscle bundles w/o defect Knee intact(left)  Lymphadenopathy:    She has no cervical adenopathy.  Neurological: She is alert and oriented to person, place, and  time. No cranial nerve deficit.  Psychiatric: She has a normal mood and affect. Her behavior is normal. Judgment and thought content normal.       Assessment & Plan:  Need for prophylactic vaccination and inoculation against influenza - Plan: Flu Vaccine QUAD 36+ mos IM  Hypothyroidism, unspecified hypothyroidism type - Plan: CBC with Differential/Platelet, TSH  Hyperlipidemia - Plan: Lipid panel///this will be accur meas off meds  L post thigh strain--ROM-avoid reinjury  Will ref meds after labs

## 2015-02-01 ENCOUNTER — Encounter: Payer: Self-pay | Admitting: Internal Medicine

## 2015-02-06 ENCOUNTER — Other Ambulatory Visit: Payer: Self-pay | Admitting: Internal Medicine

## 2015-02-25 ENCOUNTER — Ambulatory Visit (INDEPENDENT_AMBULATORY_CARE_PROVIDER_SITE_OTHER): Payer: BLUE CROSS/BLUE SHIELD | Admitting: Internal Medicine

## 2015-02-25 VITALS — BP 118/80 | HR 92 | Temp 98.1°F | Resp 18 | Ht 64.0 in | Wt 118.6 lb

## 2015-02-25 DIAGNOSIS — R059 Cough, unspecified: Secondary | ICD-10-CM

## 2015-02-25 DIAGNOSIS — R05 Cough: Secondary | ICD-10-CM | POA: Diagnosis not present

## 2015-02-25 DIAGNOSIS — J0100 Acute maxillary sinusitis, unspecified: Secondary | ICD-10-CM | POA: Diagnosis not present

## 2015-02-25 MED ORDER — AMOXICILLIN 875 MG PO TABS: 875.0000 mg | ORAL_TABLET | Freq: Two times a day (BID) | ORAL | Status: DC

## 2015-02-25 MED ORDER — HYDROCOD POLST-CPM POLST ER 10-8 MG/5ML PO SUER: 5.0000 mL | Freq: Two times a day (BID) | ORAL | Status: DC | PRN

## 2015-02-25 NOTE — Progress Notes (Signed)
Subjective:  By signing my name below, I, Rawaa Al Rifaie, attest that this documentation has been prepared under the direction and in the presence of Ellamae Siaobert Demonte Dobratz, MD.  Watt Climesawaa Al Rifaie, Medical Scribe. 02/25/2015.  10:16 AM.   Patient ID: Alyssa AmassMarie C Velazquez, female    DOB: 1955/12/13, 60 y.o.   MRN: [id number]  Chief Complaint  Patient presents with  . URI    x3 week, green phlegm and runny nose     HPI HPI Comments: Alyssa AmassMarie C Josephs is a 60 y.o. female who presents to [facility] complaining of a possible sinusitis, gradual onset  Pt reports symptoms of cough, sleep disturbance secondary to the cough, right ear ache, thick- green colored rhinorrhea, difficulty with breathing. She denies fever, or activity change.  Pt states that she usually experiences side effects with normal OTC medications.    Patient Active Problem List   Diagnosis Date Noted  . Hypothyroidism 01/03/2012  . Hyperlipidemia 01/03/2012   Past Medical History  Diagnosis Date  . Thyroid disease    History reviewed. No pertinent past surgical history. Allergies  Allergen Reactions  . Robitussin (Alcohol Free) [Guaifenesin]    Prior to Admission medications   Medication Sig Start Date End Date Taking? Authorizing Provider  atorvastatin (LIPITOR) 10 MG tablet TAKE 1 TABLET (10 MG TOTAL) BY MOUTH DAILY. 08/24/14  Yes Tonye Pearsonobert P Jaivyn Gulla, MD  levothyroxine (SYNTHROID, LEVOTHROID) 50 MCG tablet TAKE 1 TABLETS BY MOUTH EVERY MORNING BEFORE BREAKFAST 02/08/15  Yes Tonye Pearsonobert P Jacqueline Delapena, MD   Social History   Social History  . Marital Status: Divorced    Spouse Name: N/A  . Number of Children: N/A  . Years of Education: N/A   Occupational History  . Not on file.   Social History Main Topics  . Smoking status: Never Smoker   . Smokeless tobacco: Not on file  . Alcohol Use: No  . Drug Use: No  . Sexual Activity: Not on file   Other Topics Concern  . Not on file   Social History Narrative     Review of Systems  Constitutional: Negative for fever and activity change.  HENT: Positive for ear pain and rhinorrhea.   Respiratory: Positive for cough.   Psychiatric/Behavioral: Positive for sleep disturbance.      Objective:   Physical Exam  Constitutional: She is oriented to person, place, and time. She appears well-developed and well-nourished. No distress.  HENT:  Head: Normocephalic and atraumatic.  Right Ear: External ear normal.  Left Ear: External ear normal.  Mouth/Throat: Oropharynx is clear and moist.  Purulent discharge in the nares.  Throat is clear.  TM's are normal.   Eyes: Conjunctivae and EOM are normal. Pupils are equal, round, and reactive to light.  Neck: Neck supple.  Cardiovascular: Normal rate.   Pulmonary/Chest: Effort normal and breath sounds normal. No respiratory distress. She has no wheezes. She has no rales.  Neurological: She is alert and oriented to person, place, and time. No cranial nerve deficit.  Skin: Skin is warm and dry.  Psychiatric: She has a normal mood and affect. Her behavior is normal.  Nursing note and vitals reviewed.   BP 118/80 mmHg  Pulse 92  Temp(Src) 98.1 F (36.7 C) (Oral)  Resp 18  Ht 5\' 4"  (1.626 m)  Wt 118 lb 9.6 oz (53.797 kg)  BMI 20.35 kg/m2  SpO2 98%     Assessment & Plan:  Acute maxillary sinusitis, recurrence not specified  Cough  Meds ordered this encounter  Medications  . amoxicillin (AMOXIL) 875 MG tablet    Sig: Take 1 tablet (875 mg total) by mouth 2 (two) times daily.    Dispense:  20 tablet    Refill:  0  . chlorpheniramine-HYDROcodone (TUSSIONEX PENNKINETIC ER) 10-8 MG/5ML SUER    Sig: Take 5 mLs by mouth every 12 (twelve) hours as needed for cough.    Dispense:  140 mL    Refill:  0      I have completed the patient encounter in its entirety as documented by the scribe, with editing by me where necessary. Mabel Unrein P. Merla Riches, M.D.

## 2015-05-05 ENCOUNTER — Other Ambulatory Visit: Payer: Self-pay | Admitting: Internal Medicine

## 2015-07-11 ENCOUNTER — Other Ambulatory Visit: Payer: Self-pay

## 2015-07-11 MED ORDER — LEVOTHYROXINE SODIUM 50 MCG PO TABS: ORAL_TABLET | ORAL | Status: DC

## 2015-08-02 ENCOUNTER — Other Ambulatory Visit: Payer: Self-pay | Admitting: Internal Medicine

## 2015-09-08 ENCOUNTER — Other Ambulatory Visit: Payer: Self-pay | Admitting: Obstetrics & Gynecology

## 2015-09-09 LAB — CYTOLOGY - PAP

## 2015-09-14 ENCOUNTER — Other Ambulatory Visit: Payer: Self-pay

## 2015-09-14 MED ORDER — LEVOTHYROXINE SODIUM 50 MCG PO TABS: ORAL_TABLET | ORAL | 0 refills | Status: DC

## 2015-10-20 ENCOUNTER — Other Ambulatory Visit: Payer: Self-pay

## 2015-10-20 MED ORDER — ATORVASTATIN CALCIUM 10 MG PO TABS: 10.0000 mg | ORAL_TABLET | Freq: Every day | ORAL | 0 refills | Status: DC

## 2015-10-20 MED ORDER — LEVOTHYROXINE SODIUM 50 MCG PO TABS: ORAL_TABLET | ORAL | 0 refills | Status: DC

## 2015-10-20 NOTE — Addendum Note (Signed)
Addended by: Sheppard PlumberBRIGGS, Maizy Davanzo A on: 10/20/2015 10:18 AM   Modules accepted: Orders

## 2015-10-20 NOTE — Telephone Encounter (Addendum)
Pharm faxed RF req for levothyroxine. LMOM to RTC for f/up or call w/plan so that she doesn't run out of med and throw the results of the TSH off when done. Also, req for atorvastatin. Pt should come to OV fasting for lipid panel.

## 2015-10-20 NOTE — Telephone Encounter (Addendum)
Pt CB and I explained same day appts and need to fast for labs. She agreed to try to call this afternoon and get in tomorrow, but asked if I could send in 4-5 days of med just in case she can't for some reason. Done.

## 2015-10-21 ENCOUNTER — Ambulatory Visit (INDEPENDENT_AMBULATORY_CARE_PROVIDER_SITE_OTHER): Payer: BLUE CROSS/BLUE SHIELD | Admitting: Urgent Care

## 2015-10-21 VITALS — BP 124/70 | HR 78 | Temp 98.1°F | Resp 16 | Ht 64.0 in | Wt 120.0 lb

## 2015-10-21 DIAGNOSIS — E039 Hypothyroidism, unspecified: Secondary | ICD-10-CM | POA: Diagnosis not present

## 2015-10-21 DIAGNOSIS — E785 Hyperlipidemia, unspecified: Secondary | ICD-10-CM | POA: Diagnosis not present

## 2015-10-21 LAB — THYROID PANEL WITH TSH
Free Thyroxine Index: 2.5 (ref 1.4–3.8)
T3 Uptake: 33 % (ref 22–35)
T4, Total: 7.7 ug/dL (ref 4.5–12.0)
TSH: 1.74 mIU/L

## 2015-10-21 LAB — LIPID PANEL
CHOL/HDL RATIO: 2.9 ratio (ref <=5.0)
CHOLESTEROL: 213 mg/dL — AB (ref 125–200)
HDL: 74 mg/dL (ref >=46)
LDL Cholesterol: 118 mg/dL (ref <130)
TRIGLYCERIDES: 105 mg/dL (ref <150)
VLDL: 21 mg/dL (ref <30)

## 2015-10-21 MED ORDER — ATORVASTATIN CALCIUM 10 MG PO TABS: 10.0000 mg | ORAL_TABLET | Freq: Every day | ORAL | 3 refills | Status: AC

## 2015-10-21 MED ORDER — LEVOTHYROXINE SODIUM 50 MCG PO TABS: ORAL_TABLET | ORAL | 3 refills | Status: DC

## 2015-10-21 NOTE — Progress Notes (Signed)
    MRN: [id number] DOB: [birth year]  Subjective:   Alyssa Velazquez is a 60 y.o. female presenting for follow up on HL, Hypothyroidism.   Hypothyroidism - Managed with levothyroxine 50mcg. Patient has been steady on this dose. Reports compliance. Denies depression, cold intolerance, hair thinning, dry skin, rashes, throat masses.   HL - managed with Lipitor 10mg . Eats healthily and stays active. Denies confusion, myalgia. Denies chest pain, heart racing, palpitations. Denies smoking cigarettes or drinking alcohol.   Alyssa Velazquez has a current medication list which includes the following prescription(s): atorvastatin and levothyroxine. Also is allergic to robitussin (alcohol free) [guaifenesin].  Alyssa Velazquez  has a past medical history of Thyroid disease. Also  has no past surgical history on file.  Objective:   Vitals: BP 124/70   Pulse 78   Temp 98.1 F (36.7 C) (Oral)   Resp 16   Ht 5\' 4"  (1.626 m)   Wt 120 lb (54.4 kg)   SpO2 98%   BMI 20.60 kg/m   Physical Exam  Constitutional: She is oriented to person, place, and time. She appears well-developed and well-nourished.  HENT:  Mouth/Throat: Oropharynx is clear and moist.  Eyes: No scleral icterus.  Neck: Normal range of motion. Neck supple. No thyromegaly present.  Cardiovascular: Normal rate, regular rhythm and intact distal pulses.  Exam reveals no gallop and no friction rub.   No murmur heard. Pulmonary/Chest: No respiratory distress. She has no wheezes. She has no rales.  Abdominal: Soft. Bowel sounds are normal. She exhibits no distension and no mass. There is no tenderness.  Musculoskeletal: She exhibits no edema.  Neurological: She is alert and oriented to person, place, and time.  Skin: Skin is warm and dry.   Assessment and Plan :   1. Hyperlipidemia - Refilled Lipitor. Continue healthy diet. Labs pending.  2. Hypothyroidism, unspecified hypothyroidism type - Refilled levothyroxine.   Recommended patient set up an annual  exam to establish care since Dr. Merla Richesoolittle is now retired. Patient plans on doing this soon.  Wallis BambergMario Yolani Vo, PA-C [facility] [phone] 10/21/2015 9:57 AM

## 2015-10-21 NOTE — Patient Instructions (Addendum)
Hypothyroidism Hypothyroidism is a disorder of the thyroid. The thyroid is a large gland that is located in the lower front of the neck. The thyroid releases hormones that control how the body works. With hypothyroidism, the thyroid does not make enough of these hormones. CAUSES Causes of hypothyroidism may include:  Viral infections.  Pregnancy.  Your own defense system (immune system) attacking your thyroid.  Certain medicines.  Birth defects.  Past radiation treatments to your head or neck.  Past treatment with radioactive iodine.  Past surgical removal of part or all of your thyroid.  Problems with the gland that is located in the center of your brain (pituitary). SIGNS AND SYMPTOMS Signs and symptoms of hypothyroidism may include:  Feeling as though you have no energy (lethargy).  Inability to tolerate cold.  Weight gain that is not explained by a change in diet or exercise habits.  Dry skin.  Coarse hair.  Menstrual irregularity.  Slowing of thought processes.  Constipation.  Sadness or depression. DIAGNOSIS  Your health care provider may diagnose hypothyroidism with blood tests and ultrasound tests. TREATMENT Hypothyroidism is treated with medicine that replaces the hormones that your body does not make. After you begin treatment, it may take several weeks for symptoms to go away. HOME CARE INSTRUCTIONS   Take medicines only as directed by your health care provider.  If you start taking any new medicines, tell your health care provider.  Keep all follow-up visits as directed by your health care provider. This is important. As your condition improves, your dosage needs may change. You will need to have blood tests regularly so that your health care provider can watch your condition. SEEK MEDICAL CARE IF:  Your symptoms do not get better with treatment.  You are taking thyroid replacement medicine and:  You sweat excessively.  You have tremors.  You  feel anxious.  You lose weight rapidly.  You cannot tolerate heat.  You have emotional swings.  You have diarrhea.  You feel weak. SEEK IMMEDIATE MEDICAL CARE IF:   You develop chest pain.  You develop an irregular heartbeat.  You develop a rapid heartbeat.   This information is not intended to replace advice given to you by your health care provider. Make sure you discuss any questions you have with your health care provider.   Document Released: 02/05/2005 Document Revised: 02/26/2014 Document Reviewed: 06/23/2013 Elsevier Interactive Patient Education 2016 Elsevier Inc.     Cholesterol Cholesterol is a white, waxy, fat-like substance needed by your body in small amounts. The liver makes all the cholesterol you need. Cholesterol is carried from the liver by the blood through the blood vessels. Deposits of cholesterol (plaque) may build up on blood vessel walls. These make the arteries narrower and stiffer. Cholesterol plaques increase the risk for heart attack and stroke.  You cannot feel your cholesterol level even if it is very high. The only way to know it is high is with a blood test. Once you know your cholesterol levels, you should keep a record of the test results. Work with your health care provider to keep your levels in the desired range.  WHAT DO THE RESULTS MEAN?  Total cholesterol is a rough measure of all the cholesterol in your blood.   LDL is the so-called bad cholesterol. This is the type that deposits cholesterol in the walls of the arteries. You want this level to be low.   HDL is the good cholesterol because it cleans the arteries and  carries the LDL away. You want this level to be high.  Triglycerides are fat that the body can either burn for energy or store. High levels are closely linked to heart disease.  WHAT ARE THE DESIRED LEVELS OF CHOLESTEROL?  Total cholesterol below 200.   LDL below 100 for people at risk, below 70 for those at very  high risk.   HDL above 50 is good, above 60 is best.   Triglycerides below 150.  HOW CAN I LOWER MY CHOLESTEROL?  Diet. Follow your diet programs as directed by your health care provider.   Choose fish or white meat chicken and [place], roasted or baked. Limit fatty cuts of red meat, fried foods, and processed meats, such as sausage and lunch meats.   Eat lots of fresh fruits and vegetables.  Choose whole grains, beans, pasta, potatoes, and cereals.   Use only small amounts of olive, corn, or canola oils.   Avoid butter, mayonnaise, shortening, or palm kernel oils.  Avoid foods with trans fats.   Drink skim or nonfat milk and eat low-fat or nonfat yogurt and cheeses. Avoid whole milk, cream, ice cream, egg yolks, and full-fat cheeses.   Healthy desserts include angel food cake, ginger snaps, animal crackers, hard candy, popsicles, and low-fat or nonfat frozen yogurt. Avoid pastries, cakes, pies, and cookies.   Exercise. Follow your exercise programs as directed by your health care provider.   A regular program helps decrease LDL and raise HDL.   A regular program helps with weight control.   Do things that increase your activity level like gardening, walking, or taking the stairs. Ask your health care provider about how you can be more active in your daily life.   Medicine. Take medicine only as directed by your health care provider.   Medicine may be prescribed by your health care provider to help lower cholesterol and decrease the risk for heart disease.   If you have several risk factors, you may need medicine even if your levels are normal.   This information is not intended to replace advice given to you by your health care provider. Make sure you discuss any questions you have with your health care provider.   Document Released: 10/31/2000 Document Revised: 02/26/2014 Document Reviewed: 11/19/2012 Elsevier Interactive Patient Education 2016 [employer]     IF you received an x-ray today, you will receive an invoice from [facility]. Please contact [facility] at [phone] with questions or concerns regarding your invoice.   IF you received labwork today, you will receive an invoice from [facility]. Please contact [facility] at [phone] with questions or concerns regarding your invoice.   Our billing staff will not be able to assist you with questions regarding bills from these companies.  You will be contacted with the lab results as soon as they are available. The fastest way to get your results is to activate your My Chart account. Instructions are located on the last page of this paperwork. If you have not heard from us regarding the results in 2 weeks, please contact this office.

## 2015-11-01 ENCOUNTER — Encounter: Payer: Self-pay | Admitting: Urgent Care

## 2016-04-11 ENCOUNTER — Other Ambulatory Visit: Payer: Self-pay | Admitting: Family Medicine

## 2016-04-11 DIAGNOSIS — R5381 Other malaise: Secondary | ICD-10-CM

## 2016-08-21 ENCOUNTER — Other Ambulatory Visit: Payer: Self-pay | Admitting: Family Medicine

## 2016-08-21 DIAGNOSIS — E2839 Other primary ovarian failure: Secondary | ICD-10-CM

## 2016-08-30 ENCOUNTER — Other Ambulatory Visit: Payer: Self-pay

## 2016-10-18 ENCOUNTER — Ambulatory Visit
Admission: RE | Admit: 2016-10-18 | Discharge: 2016-10-18 | Disposition: A | Discharge status: Home / Self Care | Payer: BLUE CROSS/BLUE SHIELD | Source: Ambulatory Visit | Attending: Family Medicine | Admitting: Family Medicine

## 2016-10-18 DIAGNOSIS — E2839 Other primary ovarian failure: Secondary | ICD-10-CM

## 2016-10-20 ENCOUNTER — Other Ambulatory Visit: Payer: Self-pay | Admitting: Urgent Care

## 2019-04-16 ENCOUNTER — Other Ambulatory Visit: Payer: Self-pay | Admitting: Physician Assistant

## 2019-04-16 DIAGNOSIS — E2839 Other primary ovarian failure: Secondary | ICD-10-CM

## 2019-05-01 ENCOUNTER — Ambulatory Visit: Payer: 59 | Attending: Internal Medicine

## 2019-05-01 DIAGNOSIS — Z23 Encounter for immunization: Secondary | ICD-10-CM

## 2019-05-01 NOTE — Progress Notes (Signed)
   Covid-19 Vaccination Clinic  Name:  Alyssa Velazquez    MRN: [id number] DOB: 11-29-1955  05/01/2019  [name] was observed post Covid-19 immunization for 15 minutes without incident. She was provided with Vaccine Information Sheet and instruction to access the V-Safe system.   [name] was instructed to call 911 with any severe reactions post vaccine: Marland Kitchen Difficulty breathing  . Swelling of face and throat  . A fast heartbeat  . A bad rash all over body  . Dizziness and weakness   Immunizations Administered    Name Date Dose VIS Date Route   Pfizer COVID-19 Vaccine 05/01/2019  1:00 PM 0.3 mL 01/30/2019 Intramuscular   Manufacturer: [employer], [employer]   Lot: [id number]   NDC: [address]-6

## 2019-05-25 ENCOUNTER — Ambulatory Visit: Payer: 59 | Attending: Internal Medicine

## 2019-05-25 DIAGNOSIS — Z23 Encounter for immunization: Secondary | ICD-10-CM

## 2019-05-25 NOTE — Progress Notes (Signed)
   Covid-19 Vaccination Clinic  Name:  Alyssa Velazquez    MRN: [id number] DOB: Jan 10, 1956  05/25/2019  [name] was observed post Covid-19 immunization for 15 minutes without incident. She was provided with Vaccine Information Sheet and instruction to access the V-Safe system.   Ms. Thrush was instructed to call 911 with any severe reactions post vaccine: Marland Kitchen Difficulty breathing  . Swelling of face and throat  . A fast heartbeat  . A bad rash all over body  . Dizziness and weakness   Immunizations Administered    Name Date Dose VIS Date Route   Pfizer COVID-19 Vaccine 05/25/2019  1:57 PM 0.3 mL 01/30/2019 Intramuscular   Manufacturer: [employer], [employer]   Lot: [id number]   NDC: [address]-8

## 2020-04-15 DIAGNOSIS — Z1211 Encounter for screening for malignant neoplasm of colon: Secondary | ICD-10-CM | POA: Diagnosis not present

## 2020-04-15 DIAGNOSIS — R7303 Prediabetes: Secondary | ICD-10-CM | POA: Diagnosis not present

## 2020-04-15 DIAGNOSIS — E2839 Other primary ovarian failure: Secondary | ICD-10-CM | POA: Diagnosis not present

## 2020-04-15 DIAGNOSIS — Z23 Encounter for immunization: Secondary | ICD-10-CM | POA: Diagnosis not present

## 2020-04-15 DIAGNOSIS — E039 Hypothyroidism, unspecified: Secondary | ICD-10-CM | POA: Diagnosis not present

## 2020-04-15 DIAGNOSIS — E785 Hyperlipidemia, unspecified: Secondary | ICD-10-CM | POA: Diagnosis not present

## 2020-04-15 DIAGNOSIS — Z0000 Encounter for general adult medical examination without abnormal findings: Secondary | ICD-10-CM | POA: Diagnosis not present

## 2020-04-15 DIAGNOSIS — M1712 Unilateral primary osteoarthritis, left knee: Secondary | ICD-10-CM | POA: Diagnosis not present

## 2020-04-18 ENCOUNTER — Other Ambulatory Visit: Payer: Self-pay | Admitting: Physician Assistant

## 2020-04-18 DIAGNOSIS — E2839 Other primary ovarian failure: Secondary | ICD-10-CM

## 2020-08-10 ENCOUNTER — Other Ambulatory Visit: Payer: Self-pay | Admitting: Physician Assistant

## 2020-08-10 DIAGNOSIS — Z1231 Encounter for screening mammogram for malignant neoplasm of breast: Secondary | ICD-10-CM

## 2020-10-03 ENCOUNTER — Ambulatory Visit
Admission: RE | Admit: 2020-10-03 | Discharge: 2020-10-03 | Disposition: A | Discharge status: Home / Self Care | Payer: Medicare Other | Source: Ambulatory Visit | Attending: Physician Assistant | Admitting: Physician Assistant

## 2020-10-03 ENCOUNTER — Other Ambulatory Visit: Payer: Self-pay

## 2020-10-03 DIAGNOSIS — Z1231 Encounter for screening mammogram for malignant neoplasm of breast: Secondary | ICD-10-CM

## 2021-06-07 DIAGNOSIS — R7303 Prediabetes: Secondary | ICD-10-CM | POA: Diagnosis not present

## 2021-06-07 DIAGNOSIS — E039 Hypothyroidism, unspecified: Secondary | ICD-10-CM | POA: Diagnosis not present

## 2021-06-07 DIAGNOSIS — Z124 Encounter for screening for malignant neoplasm of cervix: Secondary | ICD-10-CM | POA: Diagnosis not present

## 2021-06-07 DIAGNOSIS — E785 Hyperlipidemia, unspecified: Secondary | ICD-10-CM | POA: Diagnosis not present

## 2021-06-07 DIAGNOSIS — Z0000 Encounter for general adult medical examination without abnormal findings: Secondary | ICD-10-CM | POA: Diagnosis not present

## 2021-06-09 ENCOUNTER — Other Ambulatory Visit: Payer: Self-pay | Admitting: Family Medicine

## 2021-06-09 DIAGNOSIS — E2839 Other primary ovarian failure: Secondary | ICD-10-CM

## 2021-07-10 DIAGNOSIS — H6693 Otitis media, unspecified, bilateral: Secondary | ICD-10-CM | POA: Diagnosis not present

## 2021-07-10 DIAGNOSIS — J069 Acute upper respiratory infection, unspecified: Secondary | ICD-10-CM | POA: Diagnosis not present

## 2021-07-19 DIAGNOSIS — H6592 Unspecified nonsuppurative otitis media, left ear: Secondary | ICD-10-CM | POA: Diagnosis not present

## 2021-07-19 DIAGNOSIS — R051 Acute cough: Secondary | ICD-10-CM | POA: Diagnosis not present

## 2021-08-03 DIAGNOSIS — H6982 Other specified disorders of Eustachian tube, left ear: Secondary | ICD-10-CM | POA: Diagnosis not present

## 2021-08-15 DIAGNOSIS — R059 Cough, unspecified: Secondary | ICD-10-CM | POA: Diagnosis not present

## 2021-09-01 DIAGNOSIS — Z124 Encounter for screening for malignant neoplasm of cervix: Secondary | ICD-10-CM | POA: Diagnosis not present

## 2021-09-01 DIAGNOSIS — E039 Hypothyroidism, unspecified: Secondary | ICD-10-CM | POA: Diagnosis not present

## 2021-09-01 DIAGNOSIS — E785 Hyperlipidemia, unspecified: Secondary | ICD-10-CM | POA: Diagnosis not present

## 2021-11-24 DIAGNOSIS — M8588 Other specified disorders of bone density and structure, other site: Secondary | ICD-10-CM | POA: Diagnosis not present

## 2021-11-24 DIAGNOSIS — E039 Hypothyroidism, unspecified: Secondary | ICD-10-CM | POA: Diagnosis not present

## 2021-11-24 DIAGNOSIS — E785 Hyperlipidemia, unspecified: Secondary | ICD-10-CM | POA: Diagnosis not present

## 2021-11-24 DIAGNOSIS — R7303 Prediabetes: Secondary | ICD-10-CM | POA: Diagnosis not present

## 2021-11-28 ENCOUNTER — Ambulatory Visit
Admission: RE | Admit: 2021-11-28 | Discharge: 2021-11-28 | Disposition: A | Discharge status: Home / Self Care | Payer: Medicare Other | Source: Ambulatory Visit | Attending: Family Medicine | Admitting: Family Medicine

## 2021-11-28 DIAGNOSIS — M810 Age-related osteoporosis without current pathological fracture: Secondary | ICD-10-CM | POA: Diagnosis not present

## 2021-11-28 DIAGNOSIS — Z780 Asymptomatic menopausal state: Secondary | ICD-10-CM | POA: Diagnosis not present

## 2021-11-28 DIAGNOSIS — E2839 Other primary ovarian failure: Secondary | ICD-10-CM

## 2021-11-28 DIAGNOSIS — M85852 Other specified disorders of bone density and structure, left thigh: Secondary | ICD-10-CM | POA: Diagnosis not present

## 2022-01-02 DIAGNOSIS — M810 Age-related osteoporosis without current pathological fracture: Secondary | ICD-10-CM | POA: Diagnosis not present

## 2022-04-09 IMAGING — MG DIGITAL SCREENING BREAST BILAT IMPLANT W/ TOMO W/ CAD
9 of 12 series · 9 of 28 positions shown · non-contrast
Comparison: Previous exam(s).

CLINICAL DATA: Screening.

EXAM:
DIGITAL SCREENING BILATERAL MAMMOGRAM WITH IMPLANTS, CAD AND
TOMOSYNTHESIS
TECHNIQUE: Bilateral screening digital craniocaudal and mediolateral oblique
mammograms were obtained. Bilateral screening digital breast
tomosynthesis was performed. The images were evaluated with
computer-aided detection. Standard and/or implant displaced views
were performed.

[R MLO]
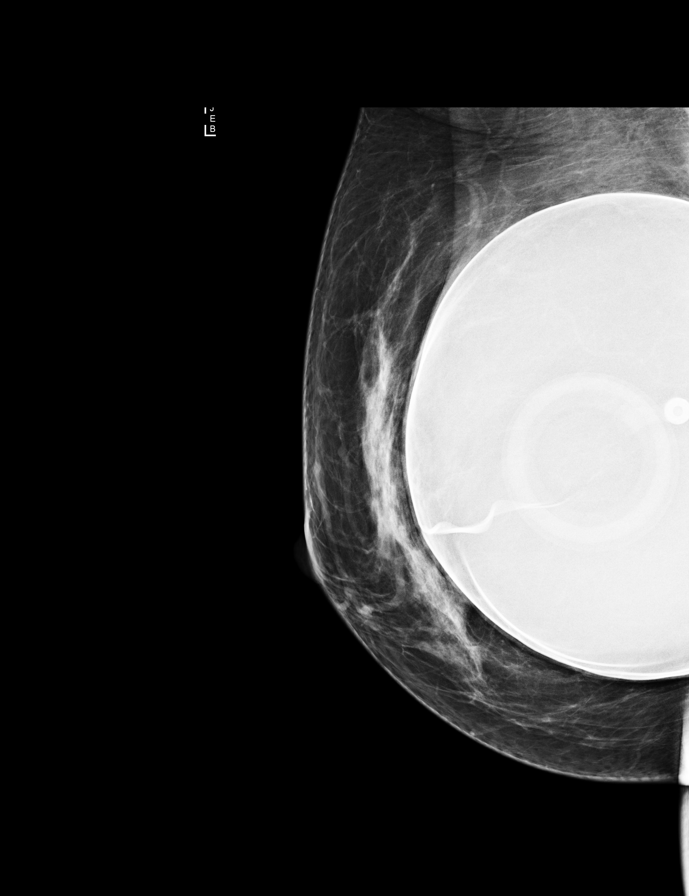

[R CC]
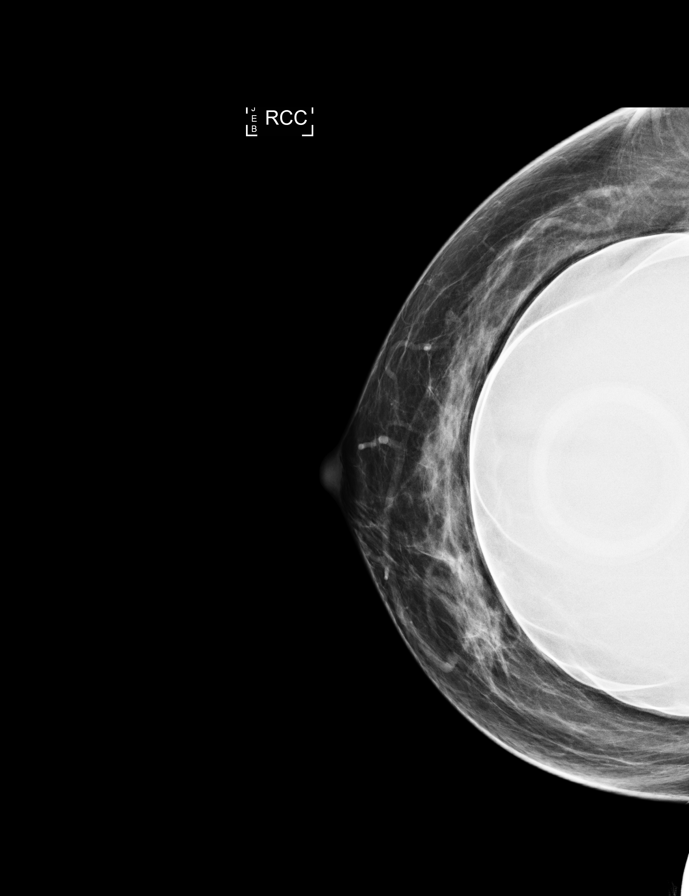

[L MLO]
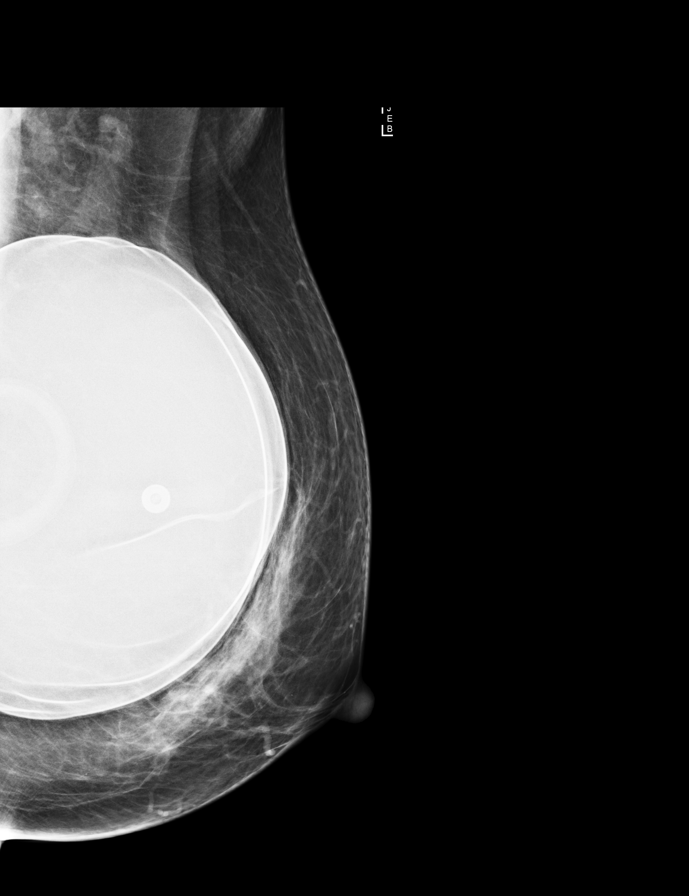

[L CC]
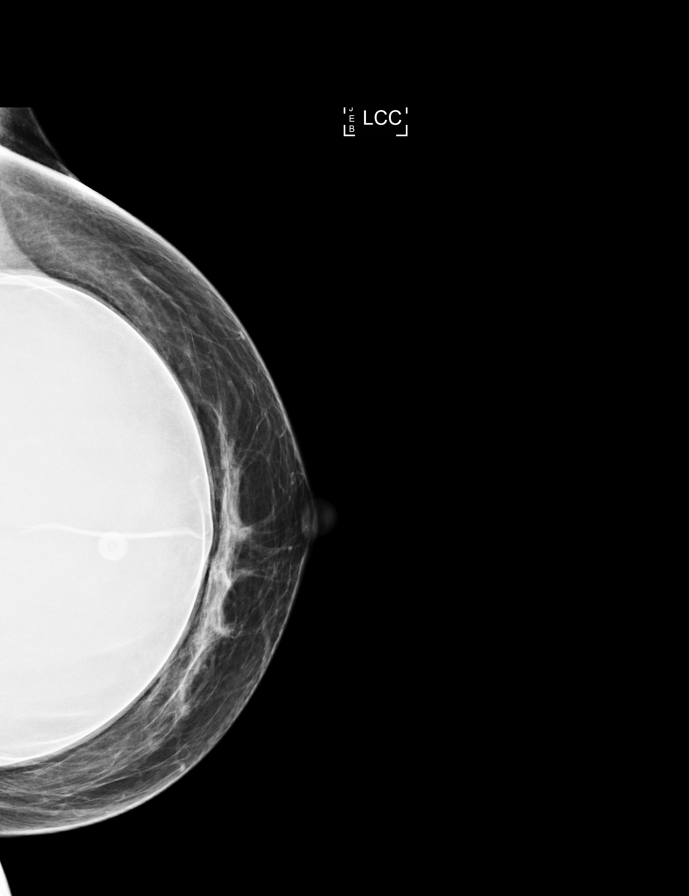

[R MLO synth-2D]
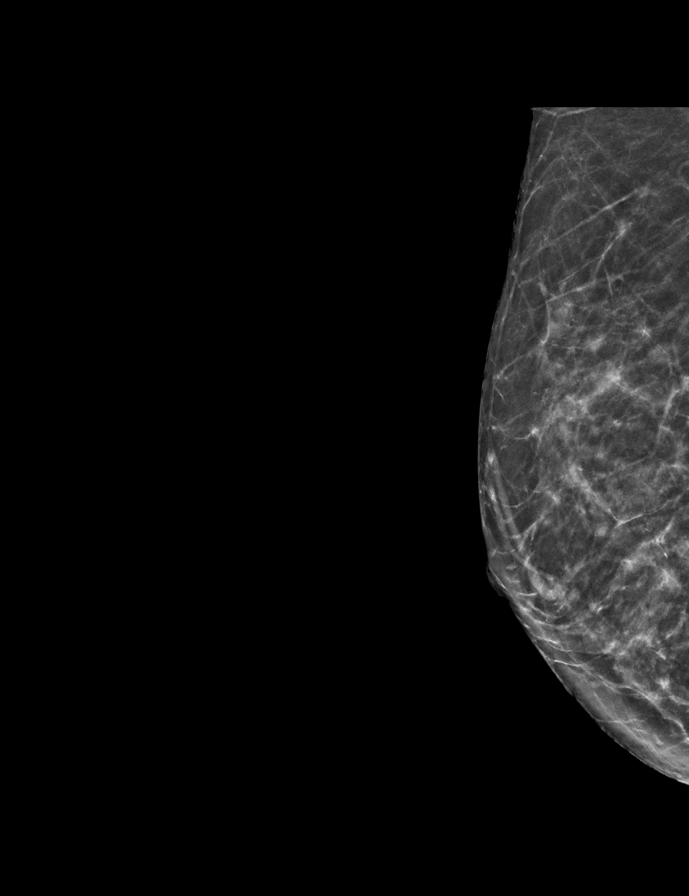

[L MLO synth-2D]
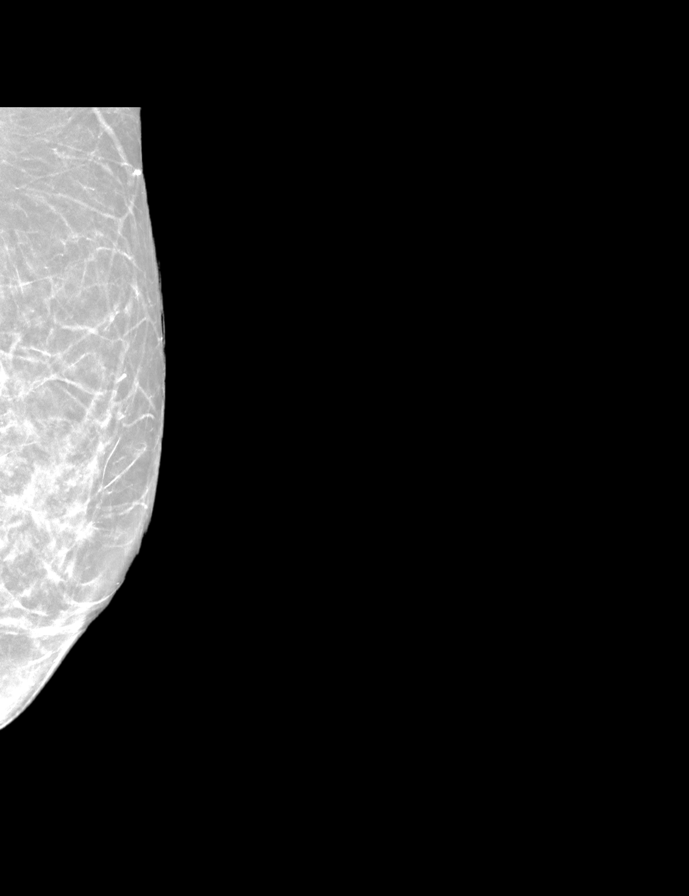

[R CC synth-2D]
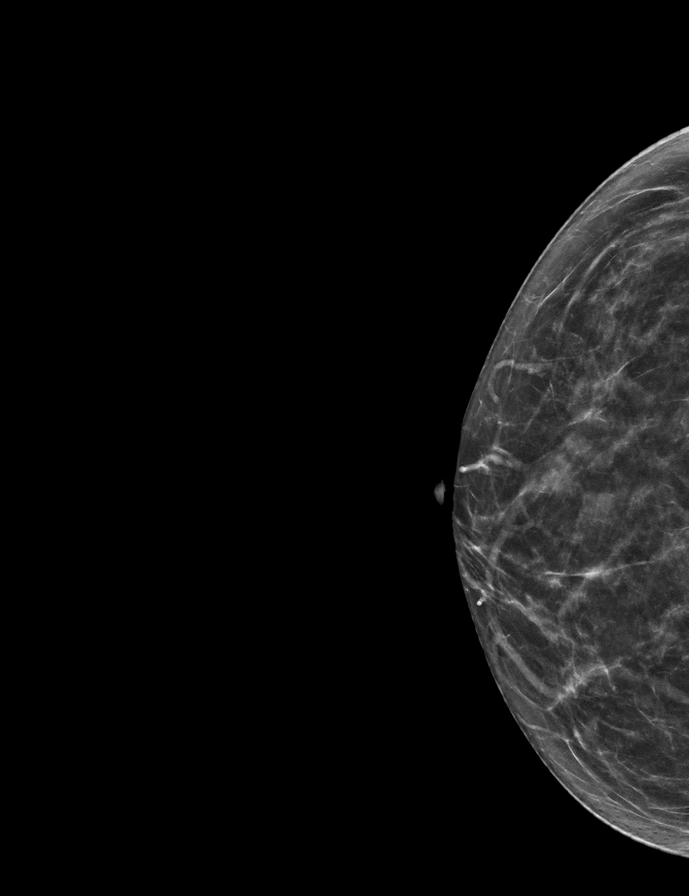

[L CC synth-2D]
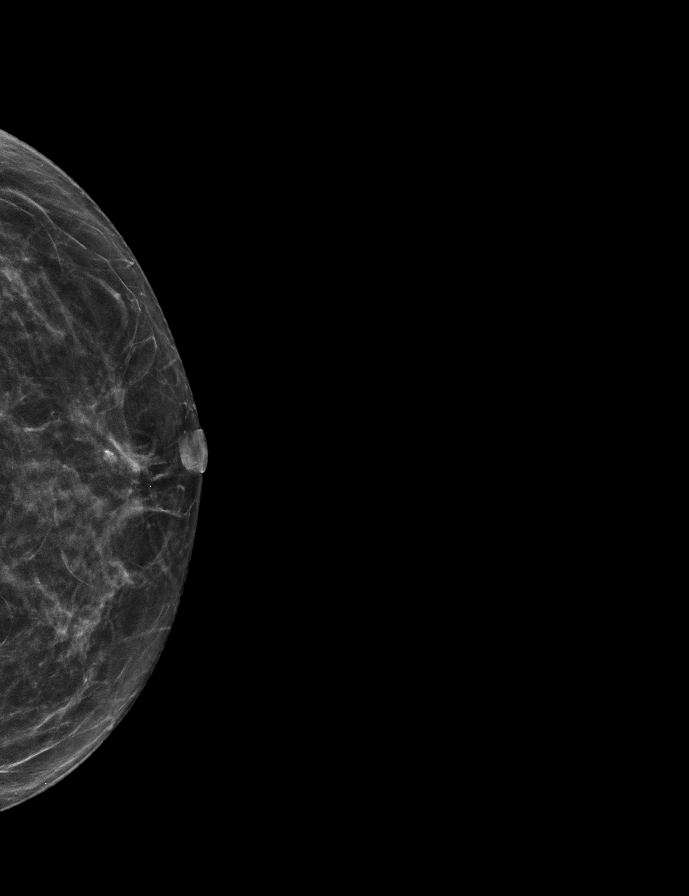

[L MLOID BREAST TOMOSYNTHESIS IMAGE tomo · tomo slice 27/53.0]
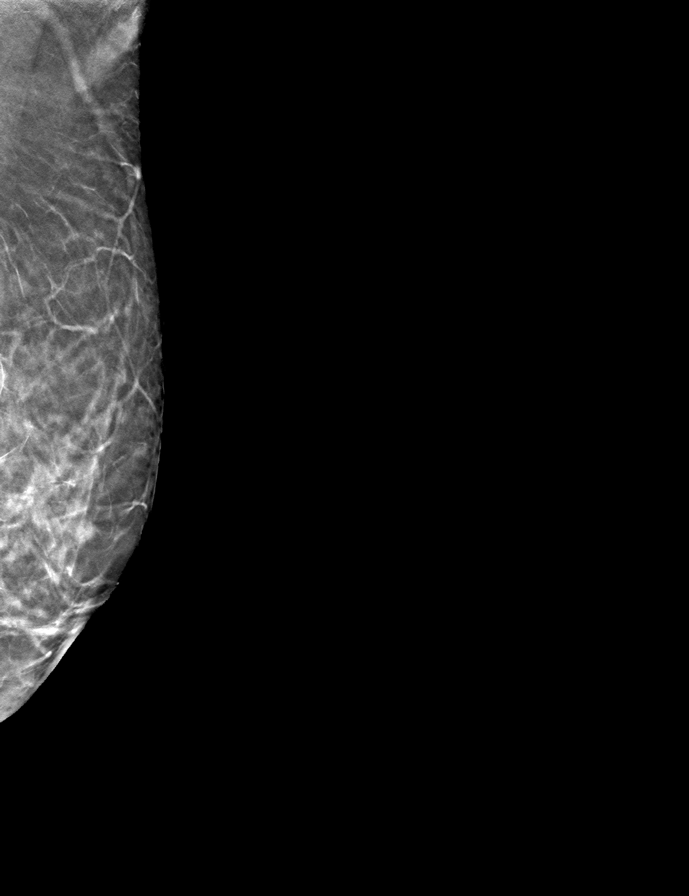

[9 of 28 positions shown; findings below may reference images not displayed]

ACR Breast Density Category c: The breast tissue is heterogeneously
dense, which may obscure small masses.
FINDINGS: The patient has retropectoral implants. There are no findings
suspicious for malignancy.
IMPRESSION: No mammographic evidence of malignancy. A result letter of this
screening mammogram will be mailed directly to the patient.

RECOMMENDATION:
Screening mammogram in one year. (Code:LT-E-7TH)

BI-RADS CATEGORY  1:  Negative.

## 2022-04-23 DIAGNOSIS — K080 Exfoliation of teeth due to systemic causes: Secondary | ICD-10-CM | POA: Diagnosis not present

## 2022-06-01 DIAGNOSIS — M810 Age-related osteoporosis without current pathological fracture: Secondary | ICD-10-CM | POA: Diagnosis not present

## 2022-06-01 DIAGNOSIS — E785 Hyperlipidemia, unspecified: Secondary | ICD-10-CM | POA: Diagnosis not present

## 2022-06-01 DIAGNOSIS — Z0000 Encounter for general adult medical examination without abnormal findings: Secondary | ICD-10-CM | POA: Diagnosis not present

## 2022-06-01 DIAGNOSIS — R7303 Prediabetes: Secondary | ICD-10-CM | POA: Diagnosis not present

## 2022-06-01 DIAGNOSIS — E039 Hypothyroidism, unspecified: Secondary | ICD-10-CM | POA: Diagnosis not present

## 2022-06-11 ENCOUNTER — Other Ambulatory Visit: Payer: Self-pay | Admitting: Family Medicine

## 2022-06-11 DIAGNOSIS — Z1231 Encounter for screening mammogram for malignant neoplasm of breast: Secondary | ICD-10-CM

## 2022-07-18 ENCOUNTER — Ambulatory Visit
Admission: RE | Admit: 2022-07-18 | Discharge: 2022-07-18 | Disposition: A | Discharge status: Home / Self Care | Payer: Medicare Other | Source: Ambulatory Visit | Attending: Family Medicine | Admitting: Family Medicine

## 2022-07-18 DIAGNOSIS — Z1231 Encounter for screening mammogram for malignant neoplasm of breast: Secondary | ICD-10-CM

## 2022-07-20 DIAGNOSIS — K080 Exfoliation of teeth due to systemic causes: Secondary | ICD-10-CM | POA: Diagnosis not present

## 2022-08-16 DIAGNOSIS — G479 Sleep disorder, unspecified: Secondary | ICD-10-CM | POA: Diagnosis not present

## 2022-08-16 DIAGNOSIS — R051 Acute cough: Secondary | ICD-10-CM | POA: Diagnosis not present

## 2022-09-28 DIAGNOSIS — K080 Exfoliation of teeth due to systemic causes: Secondary | ICD-10-CM | POA: Diagnosis not present

## 2022-10-09 DIAGNOSIS — K080 Exfoliation of teeth due to systemic causes: Secondary | ICD-10-CM | POA: Diagnosis not present

## 2022-11-26 DIAGNOSIS — K080 Exfoliation of teeth due to systemic causes: Secondary | ICD-10-CM | POA: Diagnosis not present

## 2022-11-30 DIAGNOSIS — M810 Age-related osteoporosis without current pathological fracture: Secondary | ICD-10-CM | POA: Diagnosis not present

## 2022-11-30 DIAGNOSIS — E039 Hypothyroidism, unspecified: Secondary | ICD-10-CM | POA: Diagnosis not present

## 2022-11-30 DIAGNOSIS — E785 Hyperlipidemia, unspecified: Secondary | ICD-10-CM | POA: Diagnosis not present

## 2022-11-30 DIAGNOSIS — Z23 Encounter for immunization: Secondary | ICD-10-CM | POA: Diagnosis not present

## 2022-11-30 DIAGNOSIS — R7303 Prediabetes: Secondary | ICD-10-CM | POA: Diagnosis not present

## 2023-02-19 DIAGNOSIS — K080 Exfoliation of teeth due to systemic causes: Secondary | ICD-10-CM | POA: Diagnosis not present

## 2023-04-18 DIAGNOSIS — K080 Exfoliation of teeth due to systemic causes: Secondary | ICD-10-CM | POA: Diagnosis not present

## 2023-05-30 DIAGNOSIS — J069 Acute upper respiratory infection, unspecified: Secondary | ICD-10-CM | POA: Diagnosis not present

## 2023-05-30 DIAGNOSIS — J029 Acute pharyngitis, unspecified: Secondary | ICD-10-CM | POA: Diagnosis not present

## 2023-06-12 ENCOUNTER — Other Ambulatory Visit: Payer: Self-pay | Admitting: Family Medicine

## 2023-06-12 DIAGNOSIS — Z1331 Encounter for screening for depression: Secondary | ICD-10-CM | POA: Diagnosis not present

## 2023-06-12 DIAGNOSIS — M810 Age-related osteoporosis without current pathological fracture: Secondary | ICD-10-CM | POA: Diagnosis not present

## 2023-06-12 DIAGNOSIS — E2839 Other primary ovarian failure: Secondary | ICD-10-CM

## 2023-06-12 DIAGNOSIS — Z0000 Encounter for general adult medical examination without abnormal findings: Secondary | ICD-10-CM | POA: Diagnosis not present

## 2023-06-12 DIAGNOSIS — R7303 Prediabetes: Secondary | ICD-10-CM | POA: Diagnosis not present

## 2023-06-12 DIAGNOSIS — E785 Hyperlipidemia, unspecified: Secondary | ICD-10-CM | POA: Diagnosis not present

## 2023-06-12 DIAGNOSIS — E039 Hypothyroidism, unspecified: Secondary | ICD-10-CM | POA: Diagnosis not present

## 2023-07-11 DIAGNOSIS — K080 Exfoliation of teeth due to systemic causes: Secondary | ICD-10-CM | POA: Diagnosis not present

## 2023-08-30 ENCOUNTER — Other Ambulatory Visit: Payer: Self-pay | Admitting: Family Medicine

## 2023-08-30 DIAGNOSIS — Z1231 Encounter for screening mammogram for malignant neoplasm of breast: Secondary | ICD-10-CM

## 2023-09-11 ENCOUNTER — Ambulatory Visit
Admission: RE | Admit: 2023-09-11 | Discharge: 2023-09-11 | Disposition: A | Discharge status: Home / Self Care | Source: Ambulatory Visit | Attending: Family Medicine | Admitting: Family Medicine

## 2023-09-11 DIAGNOSIS — Z1231 Encounter for screening mammogram for malignant neoplasm of breast: Secondary | ICD-10-CM

## 2023-10-02 DIAGNOSIS — M654 Radial styloid tenosynovitis [de Quervain]: Secondary | ICD-10-CM | POA: Diagnosis not present

## 2023-10-08 DIAGNOSIS — Z860101 Personal history of adenomatous and serrated colon polyps: Secondary | ICD-10-CM | POA: Diagnosis not present

## 2023-10-08 DIAGNOSIS — D124 Benign neoplasm of descending colon: Secondary | ICD-10-CM | POA: Diagnosis not present

## 2023-10-08 DIAGNOSIS — K5730 Diverticulosis of large intestine without perforation or abscess without bleeding: Secondary | ICD-10-CM | POA: Diagnosis not present

## 2023-10-08 DIAGNOSIS — Z09 Encounter for follow-up examination after completed treatment for conditions other than malignant neoplasm: Secondary | ICD-10-CM | POA: Diagnosis not present

## 2023-10-10 DIAGNOSIS — D124 Benign neoplasm of descending colon: Secondary | ICD-10-CM | POA: Diagnosis not present

## 2023-10-22 DIAGNOSIS — K080 Exfoliation of teeth due to systemic causes: Secondary | ICD-10-CM | POA: Diagnosis not present

## 2023-12-10 DIAGNOSIS — M810 Age-related osteoporosis without current pathological fracture: Secondary | ICD-10-CM | POA: Diagnosis not present

## 2023-12-10 DIAGNOSIS — R7303 Prediabetes: Secondary | ICD-10-CM | POA: Diagnosis not present

## 2023-12-10 DIAGNOSIS — E785 Hyperlipidemia, unspecified: Secondary | ICD-10-CM | POA: Diagnosis not present

## 2023-12-10 DIAGNOSIS — E039 Hypothyroidism, unspecified: Secondary | ICD-10-CM | POA: Diagnosis not present

## 2024-01-07 DIAGNOSIS — Z23 Encounter for immunization: Secondary | ICD-10-CM | POA: Diagnosis not present

## 2024-02-27 ENCOUNTER — Other Ambulatory Visit

## 2024-03-17 ENCOUNTER — Other Ambulatory Visit (HOSPITAL_BASED_OUTPATIENT_CLINIC_OR_DEPARTMENT_OTHER)

## 2024-04-13 ENCOUNTER — Other Ambulatory Visit (HOSPITAL_BASED_OUTPATIENT_CLINIC_OR_DEPARTMENT_OTHER)
